# Patient Record
Sex: Female | Born: 1975 | Race: White | Hispanic: No | Marital: Married | Smoking: Former smoker
Health system: Southern US, Community
[De-identification: ages and names within clinical notes are randomized; demographics above are authoritative.]

## PROBLEM LIST (undated history)

## (undated) DIAGNOSIS — I10 Essential (primary) hypertension: Secondary | ICD-10-CM

## (undated) DIAGNOSIS — F329 Major depressive disorder, single episode, unspecified: Secondary | ICD-10-CM

## (undated) DIAGNOSIS — E039 Hypothyroidism, unspecified: Secondary | ICD-10-CM

## (undated) DIAGNOSIS — Z9221 Personal history of antineoplastic chemotherapy: Secondary | ICD-10-CM

## (undated) DIAGNOSIS — F419 Anxiety disorder, unspecified: Secondary | ICD-10-CM

## (undated) DIAGNOSIS — F32A Depression, unspecified: Secondary | ICD-10-CM

## (undated) HISTORY — PX: OTHER SURGICAL HISTORY: SHX169

## (undated) HISTORY — DX: Anxiety disorder, unspecified: F41.9

## (undated) HISTORY — DX: Hypothyroidism, unspecified: E03.9

## (undated) HISTORY — DX: Major depressive disorder, single episode, unspecified: F32.9

## (undated) HISTORY — DX: Essential (primary) hypertension: I10

## (undated) HISTORY — DX: Depression, unspecified: F32.A

---

## 1994-05-21 HISTORY — PX: SHOULDER SURGERY: SHX246

## 2001-01-15 ENCOUNTER — Emergency Department (HOSPITAL_COMMUNITY): Admission: EM | Admit: 2001-01-15 | Discharge: 2001-01-15 | Payer: Self-pay | Admitting: Emergency Medicine

## 2001-01-15 ENCOUNTER — Encounter: Payer: Self-pay | Admitting: *Deleted

## 2001-10-02 ENCOUNTER — Other Ambulatory Visit: Admission: RE | Admit: 2001-10-02 | Discharge: 2001-10-02 | Payer: Self-pay | Admitting: Obstetrics & Gynecology

## 2001-11-19 ENCOUNTER — Inpatient Hospital Stay (HOSPITAL_COMMUNITY): Admission: AD | Admit: 2001-11-19 | Discharge: 2001-11-19 | Payer: Self-pay | Admitting: Obstetrics & Gynecology

## 2002-02-12 ENCOUNTER — Ambulatory Visit (HOSPITAL_COMMUNITY): Admission: RE | Admit: 2002-02-12 | Discharge: 2002-02-12 | Payer: Self-pay | Admitting: Obstetrics and Gynecology

## 2002-02-17 ENCOUNTER — Encounter: Admission: RE | Admit: 2002-02-17 | Discharge: 2002-05-18 | Payer: Self-pay | Admitting: Obstetrics and Gynecology

## 2002-05-18 ENCOUNTER — Inpatient Hospital Stay (HOSPITAL_COMMUNITY): Admission: AD | Admit: 2002-05-18 | Discharge: 2002-05-20 | Payer: Self-pay | Admitting: Obstetrics and Gynecology

## 2003-06-24 ENCOUNTER — Other Ambulatory Visit: Admission: RE | Admit: 2003-06-24 | Discharge: 2003-06-24 | Payer: Self-pay | Admitting: Obstetrics and Gynecology

## 2004-08-11 ENCOUNTER — Other Ambulatory Visit: Admission: RE | Admit: 2004-08-11 | Discharge: 2004-08-11 | Payer: Self-pay | Admitting: Obstetrics and Gynecology

## 2007-10-01 ENCOUNTER — Ambulatory Visit (HOSPITAL_COMMUNITY): Admission: RE | Admit: 2007-10-01 | Discharge: 2007-10-01 | Payer: Self-pay | Admitting: Internal Medicine

## 2007-10-20 ENCOUNTER — Ambulatory Visit: Payer: Self-pay | Admitting: Cardiovascular Disease

## 2007-10-30 ENCOUNTER — Encounter: Payer: Self-pay | Admitting: Cardiovascular Disease

## 2007-10-30 ENCOUNTER — Ambulatory Visit: Payer: Self-pay

## 2008-11-30 ENCOUNTER — Ambulatory Visit (HOSPITAL_COMMUNITY): Admission: RE | Admit: 2008-11-30 | Discharge: 2008-11-30 | Payer: Self-pay | Admitting: Obstetrics and Gynecology

## 2009-02-22 ENCOUNTER — Ambulatory Visit (HOSPITAL_COMMUNITY): Admission: RE | Admit: 2009-02-22 | Discharge: 2009-02-22 | Payer: Self-pay | Admitting: Obstetrics and Gynecology

## 2009-03-30 ENCOUNTER — Inpatient Hospital Stay (HOSPITAL_COMMUNITY): Admission: AD | Admit: 2009-03-30 | Discharge: 2009-04-16 | Payer: Self-pay | Admitting: Obstetrics & Gynecology

## 2009-03-31 ENCOUNTER — Encounter: Payer: Self-pay | Admitting: Obstetrics & Gynecology

## 2009-04-14 ENCOUNTER — Encounter (INDEPENDENT_AMBULATORY_CARE_PROVIDER_SITE_OTHER): Payer: Self-pay | Admitting: Obstetrics & Gynecology

## 2009-05-21 HISTORY — PX: OTHER SURGICAL HISTORY: SHX169

## 2009-06-29 ENCOUNTER — Ambulatory Visit (HOSPITAL_COMMUNITY): Admission: RE | Admit: 2009-06-29 | Discharge: 2009-06-29 | Payer: Self-pay | Admitting: Obstetrics and Gynecology

## 2010-05-18 ENCOUNTER — Telehealth (INDEPENDENT_AMBULATORY_CARE_PROVIDER_SITE_OTHER): Payer: Self-pay | Admitting: *Deleted

## 2010-06-11 ENCOUNTER — Encounter: Payer: Self-pay | Admitting: Internal Medicine

## 2010-06-12 ENCOUNTER — Encounter: Payer: Self-pay | Admitting: Obstetrics and Gynecology

## 2010-06-22 NOTE — Progress Notes (Signed)
  Phone Note From Other Clinic   Caller: Trip/Dr.Stevenson Initial call taken by: KM    Faxed Stress Echo over to [phone] [facility]  May 18, 2010 11:47 AM

## 2010-07-25 ENCOUNTER — Encounter: Payer: Self-pay | Admitting: Physician Assistant

## 2010-07-27 ENCOUNTER — Encounter: Payer: Self-pay | Admitting: Physician Assistant

## 2010-07-27 ENCOUNTER — Ambulatory Visit (INDEPENDENT_AMBULATORY_CARE_PROVIDER_SITE_OTHER): Payer: BC Managed Care – PPO | Admitting: Physician Assistant

## 2010-07-27 DIAGNOSIS — Z720 Tobacco use: Secondary | ICD-10-CM | POA: Insufficient documentation

## 2010-07-27 DIAGNOSIS — F32A Depression, unspecified: Secondary | ICD-10-CM

## 2010-07-27 DIAGNOSIS — F329 Major depressive disorder, single episode, unspecified: Secondary | ICD-10-CM

## 2010-07-27 DIAGNOSIS — I10 Essential (primary) hypertension: Secondary | ICD-10-CM | POA: Insufficient documentation

## 2010-07-27 DIAGNOSIS — E039 Hypothyroidism, unspecified: Secondary | ICD-10-CM | POA: Insufficient documentation

## 2010-07-27 DIAGNOSIS — E119 Type 2 diabetes mellitus without complications: Secondary | ICD-10-CM | POA: Insufficient documentation

## 2010-07-27 DIAGNOSIS — F419 Anxiety disorder, unspecified: Secondary | ICD-10-CM | POA: Insufficient documentation

## 2010-07-27 DIAGNOSIS — R079 Chest pain, unspecified: Secondary | ICD-10-CM | POA: Insufficient documentation

## 2010-07-27 NOTE — Progress Notes (Signed)
History of Present Illness: Primary Cardiologist:  Dr. Charlton Haws  The patient is a 35 year old female who was evaluated by Dr. Eden Emms in 2009 for chest pain and palpitations.  She had a stress echocardiogram.  This demonstrated normal LV function and no ischemia.  She delivered premature twins about a year ago.  She has been under a great deal of stress since that time.  She was diagnosed with hypertension last August.  She was placed on metoprolol at that time.  She was complaining of some palpitations as well as chest discomfort.  This seemed to help her palpitations for quite some time.  However, 3 or 4 weeks ago she started to note chest discomfort and palpitations again.  She described a skipping sensation.  She feels that her heart rate is somewhat fast.  The chest discomfort has awoken her in the middle of the night before.  She describes it as a heavy sensation.  It is located substernally.  She had some left arm heaviness and numbness associated with it.  She denies any neck injuries.  Her left arm continues to be numb.  She has been painting quite a bit around her house.  She also chases two 49-year-old twins around her house.  She has some chest discomfort with exertion.  However, she is able to exert herself without chest discomfort.  She notes shortness of breath with her chest pain.  She denies exertional shortness of breath.  She describes NYHA class 1-2 symptoms.  She denies orthopnea, PND or pedal edema.  She denies syncope or near-syncope.  There is no reported history of snoring.  She denies daytime hypersomnolence.  Past Medical History  Diagnosis Date  . Chest pain     Stress echocardiogram June 2009: Normal LV function; no ischemia  . Hypothyroidism     No longer taking medication  . Hypertension Diagnosed August 2011  . Diabetes mellitus     Diet-controlled  . Anxiety and depression     Current Outpatient Prescriptions  Medication Sig Dispense Refill  . ALPRAZolam (XANAX)  0.5 MG tablet Take 0.5 mg by mouth 3 (three) times daily as needed.        . DULoxetine (CYMBALTA) 60 MG capsule Take 60 mg by mouth daily.        . metoprolol (TOPROL-XL) 50 MG 24 hr tablet Take 50 mg by mouth daily.          Allergies  Allergen Reactions  . Amoxicillin   . Aspirin Free Childs (Acetaminophen)   . Demerol   . Erythromycin   . Penicillins    History   Social History  . Marital Status: Married    Spouse Name: N/A    Number of Children: 7  . Years of Education: N/A   Occupational History  . Housewife    Social History Main Topics  . Smoking status: Current Everyday Smoker -- 0.5 packs/day for 24 years  . Smokeless tobacco: Not on file  . Alcohol Use: No  . Drug Use: No  . Sexually Active:    Other Topics Concern  . Not on file   Social History Narrative  . No narrative on file    ROS: Please see the history of present illness.  All other systems reviewed and negative.  Vital Signs: BP 116/75  Pulse 77  Resp 14  Ht 5\' 6"  (1.676 m)  Wt 236 lb (107.049 kg)  BMI 38.09 kg/m2  PHYSICAL EXAM: Well nourished, well developed, in no  acute distress HEENT: normal Neck: no JVD Cardiac:  normal S1, S2; RRR; no murmur Lungs:  clear to auscultation bilaterally, no wheezing, rhonchi or rales Abd: soft, nontender, no hepatomegaly Ext: no edema Skin: warm and dry Neuro:  CNs 2-12 intact, no focal abnormalities noted Endo: No thyromegaly Vascular: No carotid bruits; DP/PT 2+ bilaterally Psych: Normal affect  EKG: Normal sinus rhythm, heart rate 77, normal axis, nonspecific ST-T wave changes  LABS: From her PCPs office dated 07/25/10: Hemoglobin 13.8, potassium 5.0, creatinine 0.88, glucose 126, TC 182, Trig. 151, HDL 43, LDL 109, ALT 13, hemoglobin A1c 5.8

## 2010-07-27 NOTE — Assessment & Plan Note (Signed)
She is working on cessation.

## 2010-07-27 NOTE — Assessment & Plan Note (Addendum)
She has typical and atypical features to her chest symptoms.  Her EKG is not significantly abnormal.  She does have some nonspecific changes.  She certainly has significant risk factors for coronary disease given her diabetes and ongoing smoking.  I suggest that we proceed with a stress Myoview study.  She cannot take aspirin.  She will continue on metoprolol.  She also has some symptoms of dyspepsia.  I have suggested that she take Pepcid 20 mg twice a day for 3-4 weeks.  She can take as needed thereafter.  I will bring her back in followup with Dr. Eden Emms in the next 2-3 weeks to followup on the above testing.  She knows to contact us if she has any change in her symptoms.  Other possibilities for her chest discomfort and arm symptoms include musculoskeletal symptoms.  She does have a recent history of excessive left arm use with her painting.  This will have to be kept in mind as her evaluation is completed.

## 2010-07-27 NOTE — Assessment & Plan Note (Addendum)
Recent A1c less than 6. I would consider placing her on a statin to get her LDL less than 100 with her history of diabetes.  This is followed by her PCP.

## 2010-07-27 NOTE — Assessment & Plan Note (Signed)
This may be contributing to her symptoms.  Workup as above to rule out ischemic heart disease.

## 2010-07-27 NOTE — Assessment & Plan Note (Addendum)
Controlled.  

## 2010-08-01 NOTE — Assessment & Plan Note (Signed)
Summary: Please see noted done in [facility] that is scanned in.   CC:  chest pains also pt complians of left arm numbness.  History of Present Illness: Please see noted done in [facility] that is scanned in.   Current Medications (verified): 1)  Metoprolol Succinate 50 Mg Xr24h-Tab (Metoprolol Succinate) .... Take One Tablet By Mouth Daily  Vital Signs:  Patient profile:   35 year old female Height:      66 inches Weight:      236 pounds BMI:     38.23 Pulse rate:   77 / minute Resp:     14 per minute BP sitting:   116 / 75  (left arm)  Vitals Entered By: Kem Parkinson (July 27, 2010 11:15 AM)   Other Orders: Nuclear Stress Test (Nuc Stress Test)  Patient Instructions: 1)  Your physician recommends that you schedule a follow-up appointment in:2 to 3 weeks with Dr. Eden Emms 2)  Your physician has recommended you make the following change in your medication: Pepcid 20 mg take one tablet twice a day for 3 to 4 weeks, then twice a day as needed. 3)  Your physician has requested that you have an exercise stress myoview.  For further information please visit [email or web address]  Please follow instruction sheet, as given. Prescriptions: PEPCID 20 MG TABS (FAMOTIDINE) Take one tablet by mouth two times a day for 3 to 4 weeks, then  take one tablet two times a day as needed .  #30 x 4   Entered by:   Ollen Gross, RN, BSN   Authorized by:   Tereso Newcomer PA-C   Signed by:   Ollen Gross, RN, BSN on 07/27/2010   Method used:   Electronically to        [facility] (retail)       [address]       [place], [place]  [address]       Ph: [phone]       Fax: [phone]   RxID:   [id number]  I have personally reviewed the prescriptions today for accuracy. Tereso Newcomer PA-C  July 27, 2010 1:47 PM

## 2010-08-09 ENCOUNTER — Encounter: Payer: Self-pay | Admitting: Physician Assistant

## 2010-08-09 LAB — PREGNANCY, URINE: Preg Test, Ur: NEGATIVE

## 2010-08-09 LAB — CBC
MCHC: 33.8 g/dL (ref 30.0–36.0)
MCV: 86.1 fL (ref 78.0–100.0)
Platelets: 181 10*3/uL (ref 150–400)
RBC: 4.38 MIL/uL (ref 3.87–5.11)

## 2010-08-10 ENCOUNTER — Ambulatory Visit (HOSPITAL_COMMUNITY): Payer: BC Managed Care – PPO | Attending: Cardiovascular Disease | Admitting: Radiology

## 2010-08-10 DIAGNOSIS — R079 Chest pain, unspecified: Secondary | ICD-10-CM

## 2010-08-10 DIAGNOSIS — R0609 Other forms of dyspnea: Secondary | ICD-10-CM

## 2010-08-10 MED ORDER — TECHNETIUM TC 99M TETROFOSMIN IV KIT
11.0000 | PACK | Freq: Once | INTRAVENOUS | Status: AC | PRN
  Administered 2010-08-10: 11 via INTRAVENOUS

## 2010-08-10 MED ORDER — TECHNETIUM TC 99M TETROFOSMIN IV KIT
33.0000 | PACK | Freq: Once | INTRAVENOUS | Status: AC | PRN
  Administered 2010-08-10: 33 via INTRAVENOUS

## 2010-08-10 NOTE — Progress Notes (Signed)
[facility] SITE 3 NUCLEAR MED [address] [place] [address] [phone]  Cardiology Nuclear Med Study Jasmine Sexton female August 09, 1975   Nuclear Med Background Indication for Stress Test:  Evaluation for Ischemia History: 6/09 Stress  Echo - Normal Cardiac Risk Factors: Hypertension  Symptoms:  Chest Pain (last date of chest discomfort yesterday evening), Chest Pain with Exertion (last date of chest discomfort yesterday evening), DOE, Fatigue, Light-Headedness, Palpitations and Rapid HR   Nuclear Pre-Procedure Caffeine/Decaff Intake:  None NPO After: 7:00pm   Lungs:  Clear IV 0.9% NS with Angio Cath:  22g  IV Site: R Hand  IV Started by:  Bonnita Levan, RN  Chest Size (in):  38 Cup Size:   D  Height: 5\' 6"  (1.676 m)  Weight:  238 lb (107.956 kg)  BMI:  Body mass index is 38.41 kg/(m^2). Tech Comments: Held Metoprolol x 24 hrs     Nuclear Med Study 1 or 2 day study: 1 day  Stress Test Type:  Stress  Reading MD: Olga Millers, MD  Order Authorizing Provider:  Dr. Burna Forts  Resting Radionuclide: Technetium 92m Tetrofosmin  Resting Radionuclide Dose: 11 mCi   Stress Radionuclide:  Technetium 48m Tetrofosmin  Stress Radionuclide Dose: 33 mCi           Stress Protocol Rest HR: 74 Stress HR: 176  Rest BP: 130/80 Stress BP: 167/74  Exercise Time:  8:01 min METS: 10.2  Predicted HR: 183 % of Maximum: 96    Predicted Max HR: 183 bpm % Max HR: 96.17 bpm Rate Pressure Product: 95621    Dose of Adenosine:  N/A mg Dose of Lexiscan:  N/A mg  Dose of Atropine:  N/A mg Dose of Dobutamine:  N/A mcg/kg/min (at max HR)  Stress Test Technologist: Bonnita Levan, RN  Nuclear Technologist:  Domenic Polite, CNMT     Rest Procedure:  Myocardial perfusion imaging was performed at rest 45 minutes following the intravenous administration of Technetium 62m Tetrofosmin. Rest ECG: NSR  Stress Procedure:  The patient exercised for 8:01.  The patient stopped due to  leg fatigue and dyspnea.  She c/o chest pain 2/10 during exercise, which resolved in recovery.   There were non-specific ST-T wave changes.  Technetium 62m Tetrofosmin was injected at peak exercise and myocardial perfusion imaging was performed after a brief delay. Stress ECG: No significant change from baseline ECG  QPS Raw Data Images:  Mild breast attenuation.  Normal left ventricular size. Stress Images:  There is decreased uptake in the anterior wall. Rest Images:  There is decreased uptake in the anterior wall. Subtraction (SDS):  There is a fixed anteriour defect that is most consistent with breast attenuation.  Findings Risk Category:  Low risk nuclear study. Clinically Abnormal:  Chest Pain Ischemia:  No Fixed Defect:  Anterior (septal apical) LV Dysfunction:  No Transient Ischemic Dilatation (Normal <1.22):  1.06 Lung/Heart Ratio (Normal <0.45):  .32  Quantitative Gated Spect Images QGS EDV:  92 ml QGS ESV:  28 ml QGS cine images:  Normal wall motion. QGS EF:  69 %  Impression Exercise Capacity:  Good exercise capacity. BP Response:  Normal blood pressure response. Clinical Symptoms:  Mild chest pain/dyspnea. ECG Impression:  No significant ST segment change suggestive of ischemia. Comparison with Prior Nuclear Study: No previous nuclear study performed  Overall Impression:  Low risk stress nuclear study with breast attenuation but no ischemia; note patient did have chest pain with exercise.

## 2010-08-11 ENCOUNTER — Encounter: Payer: Self-pay | Admitting: Physician Assistant

## 2010-08-11 ENCOUNTER — Telehealth: Payer: Self-pay | Admitting: Physician Assistant

## 2010-08-11 ENCOUNTER — Encounter: Payer: Self-pay | Admitting: Cardiovascular Disease

## 2010-08-11 NOTE — Telephone Encounter (Signed)
PT AWARE OF STRESS TEST RESULTS TODAY, GAVE VERBAL UNDERSTANDING TODAY

## 2010-08-11 NOTE — Telephone Encounter (Signed)
Please call patient and notify her that her stress test is normal.

## 2010-08-16 ENCOUNTER — Ambulatory Visit: Payer: BC Managed Care – PPO | Admitting: Physician Assistant

## 2010-08-23 LAB — CBC
HCT: 32.3 % — ABNORMAL LOW (ref 36.0–46.0)
HCT: 32.3 % — ABNORMAL LOW (ref 36.0–46.0)
Hemoglobin: 10.9 g/dL — ABNORMAL LOW (ref 12.0–15.0)
MCHC: 33.8 g/dL (ref 30.0–36.0)
MCHC: 34.2 g/dL (ref 30.0–36.0)
MCHC: 34.2 g/dL (ref 30.0–36.0)
MCV: 87.5 fL (ref 78.0–100.0)
MCV: 88.1 fL (ref 78.0–100.0)
MCV: 86.9 fL (ref 78.0–100.0)
Platelets: 164 10*3/uL (ref 150–400)
Platelets: 187 10*3/uL (ref 150–400)
Platelets: 190 10*3/uL (ref 150–400)
RBC: 3.53 MIL/uL — ABNORMAL LOW (ref 3.87–5.11)
RBC: 3.81 MIL/uL — ABNORMAL LOW (ref 3.87–5.11)
RDW: 13.1 % (ref 11.5–15.5)
RDW: 13.2 % (ref 11.5–15.5)
RDW: 13.2 % (ref 11.5–15.5)
WBC: 9.9 10*3/uL (ref 4.0–10.5)

## 2010-08-23 LAB — GLUCOSE, CAPILLARY: Glucose-Capillary: 91 mg/dL (ref 70–99)

## 2010-08-23 LAB — VON WILLEBRAND FACTOR MULTIMER
Factor-VIII Activity: 123 % (ref 50–180)
Von Willebrand Factor Ag: 207 % (ref 50–217)

## 2010-08-23 LAB — STREP B DNA PROBE: Strep Group B Ag: NEGATIVE

## 2010-08-23 LAB — GLUCOSE, 2 HOUR GESTATIONAL: Glucose Tolerance, 2 hour: 198 mg/dL — ABNORMAL HIGH (ref 70–164)

## 2010-08-23 LAB — FACTOR 8 ASSAY: Coagulation Factor VIII: 143 % — ABNORMAL HIGH (ref 73–140)

## 2010-08-23 LAB — RH IMMUNE GLOB WKUP(>/=20WKS)(NOT WOMEN'S HOSP): Fetal Screen: NEGATIVE

## 2010-08-23 LAB — GLUCOSE, FASTING GESTATIONAL: Glucose Tolerance, Fasting: 102 mg/dL

## 2010-08-23 LAB — FETAL FIBRONECTIN: Fetal Fibronectin: POSITIVE

## 2010-08-23 LAB — HEMOGLOBIN A1C: Mean Plasma Glucose: 114 mg/dL

## 2010-08-23 LAB — WET PREP, GENITAL: Yeast Wet Prep HPF POC: NONE SEEN

## 2010-08-23 LAB — GLUCOSE, 3 HOUR GESTATIONAL: Glucose, GTT - 3 Hour: 183 mg/dL — ABNORMAL HIGH (ref 70–144)

## 2010-10-03 NOTE — Assessment & Plan Note (Signed)
[facility]                            CARDIOLOGY OFFICE NOTE   NAME:Jasmine Sexton, Jasmine Sexton                      MRN:          [id number]  DATE:10/20/2007                            DOB:          Feb 15, 1976    [age]-old patient referred for palpitations and chest pain.  She  has been having symptoms for about six months. They are atypical.  The  chest pain comes and goes.  There is no rhyme or reason to it.  It is  not related to exertion.  She can wake up with it.  She feels a lot of  pressure in her chest like someone is sitting on it.  There is no  associated diaphoresis or shortness of breath.  She does occasionally  get palpitations.  They are skips.  They are not prolonged rapid beats,  so there is no associated presyncope.  She has not had previous heart  problems.   There is a history of anxiety and depression postpartum.   The patient does appear to be under a little bit of stress.  She has a  good home life, but she does have five children and things are fairly  hectic.   The patient does have hypothyroidism.  She is on replacement.  Apparently he dose was decreased recently due to a suppressed TSH.  She  was told by Dr.  Elisabeth Most that this could have something to do with her  blood pressure and heart rate being up.   Patient's review of systems is otherwise remarkable for question of  anemia, fatigue.   Her past surgical history is remarkable for  shoulder surgery in 1996  and previous laparoscopic surgery in 1992 and 1993.   Patient also indicates that she has had a history of  a heart murmur.   SHE IS ALLERGIC TO AMOXICILLIN, ERYTHROMYCIN, PENICILLIN, DEMEROL,  CODEINE AND ASPIRIN.  SHE SAID SHE SWELLS UP WITH ASPIRIN.   Patient smokes a half a pack a day for the last six years.  She is  happily married.  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  She  has five children ranging in age from age 11 to 51.  She is otherwise  fairly sedentary.   Her current medications only include Synthroid 137 mcg 1/2 tablet daily.   Her exam is remarkable for an overweight white female in no distress.  Affect is appropriate.  Weight 221, blood pressure 120/80, pulse 81 and  regular.  Afebrile.  Respiratory rate 14.  HEENT:  Unremarkable.  Carotids normal without bruit.  No  lymphadenopathy, thyromegaly, JVP elevation.  LUNGS:  Clear with good diaphragmatic motion.  No wheezing.  S1 and S2 with a soft systolic murmur.  PMI normal.  ABDOMEN:  Benign.  Bowel sounds positive.  No AAA.  No tenderness.  No  bruit.  No hepatosplenomegaly, hepatojugular reflux.  No tenderness.  Distal pulses are intact.  No edema.  NEURO:  Nonfocal.  SKIN: Warm and dry.  No bruit.   EKG shows sinus rhythm with nonspecific ST-T wave changes.  Poor R  wave  progression.   1. Chest pain, atypical.  Followup stress echo.  2. Palpitations.  We will give her p.r.n. Inderal to take.  I suspect      some of this has to do with her thyroid being too high, and also      related to anxiety and depression.  3. Hypothyroidism.  Followup with Dr. Elisabeth Most.  Adjust Synthroid to      normal TSH values.  4. Anxiety, depression.  Follow up with Dr. Elisabeth Most.  May benefit      from selective serotonin reuptake inhibitor.  5. Smoking cessation.  Again, given the patient's anxiety, depression,      Wellbutrin may be a good medication for her.  I counseled the      patient for less than ten minutes regarding the effects of smoking.  6. Central obesity.  The patient needs to be on a low carbohydrate      diet.  Her dietary habits are somewhat poor.  I explained the link      between the carbohydrates, insulin resistance and weight gain to      the patient.  7. I will see the patient back in three months as long as her stress      echo is low risk, and then we will see how she responds to p.r.n.      Inderal.     Peter C. Eden Emms, MD, [facility]  Electronically  Signed    PCN/MedQ  DD: 10/20/2007  DT: 10/20/2007  Job #: [id number]   cc:   Lovenia Kim, D.O.

## 2010-10-04 IMAGING — US US OB TRANSVAGINAL
1 series · 14 of 28 positions shown · non-contrast
Comparison: none

OBSTETRICAL ULTRASOUND:
 This ultrasound exam was performed in the [HOSPITAL] Ultrasound Department.  The OB US report was generated in the AS system, and faxed to the ordering physician.  This report is also available in [REDACTED] PACS.

[Series 1: us ob transvaginal · 0.12mm/px · 43 acquisitions, 14 frames shown]
[im 2/43]
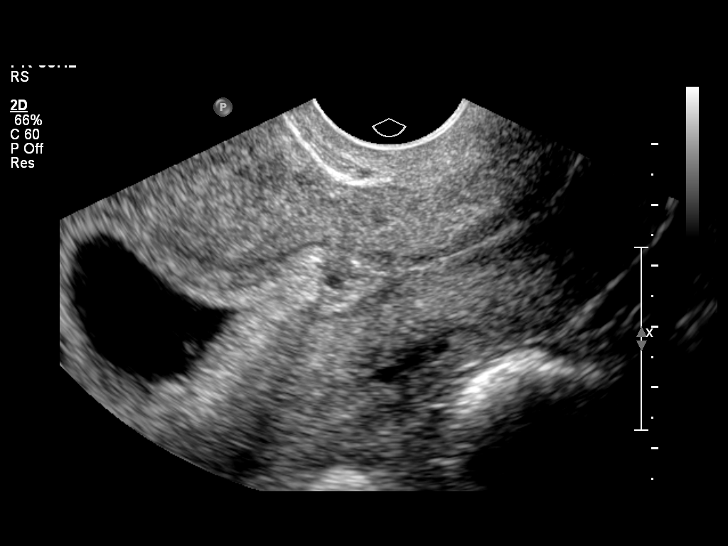
[im 5/43]
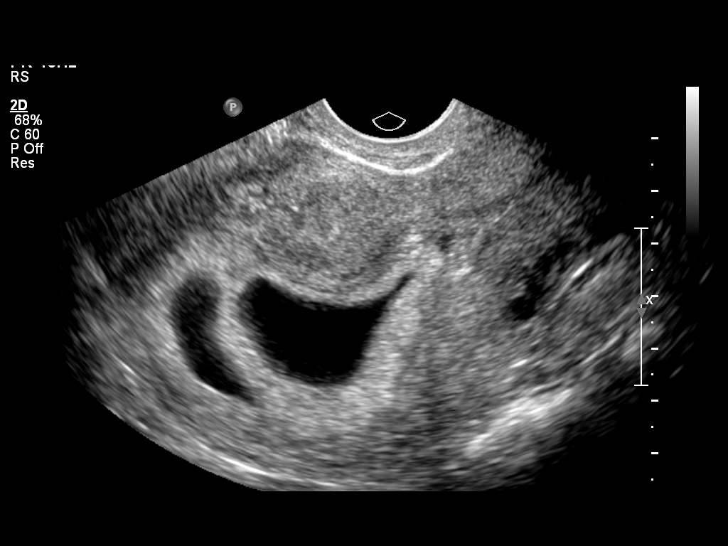
[im 8/43]
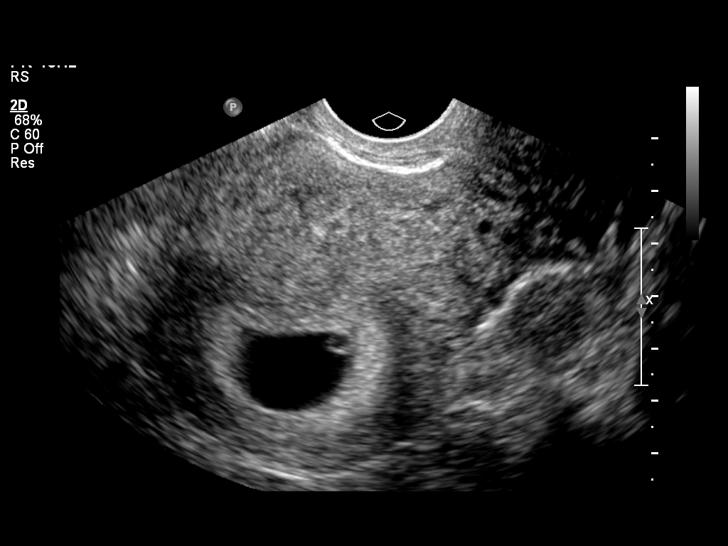
[im 11/43]
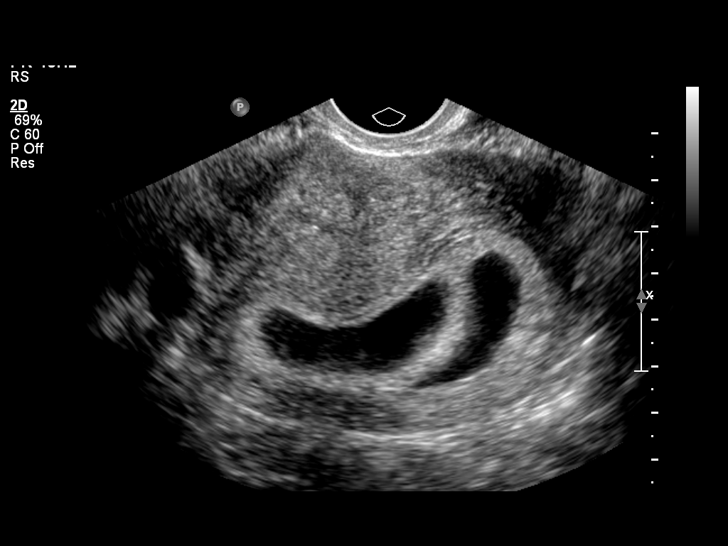
[im 15/43]
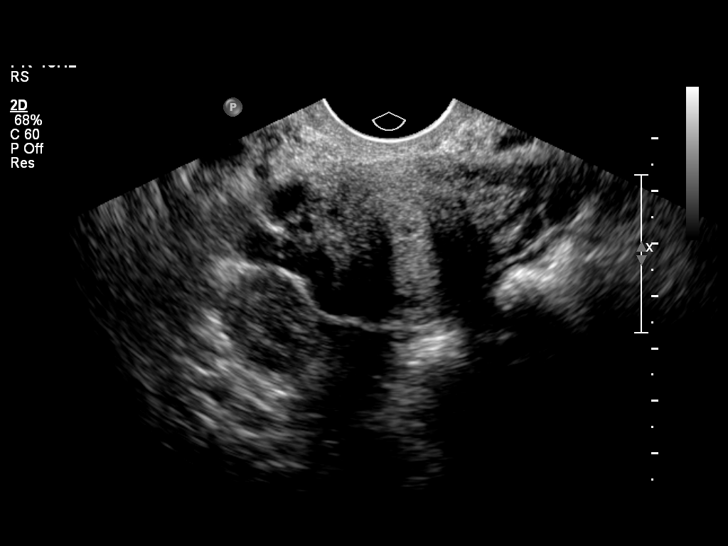
[im 18/43]
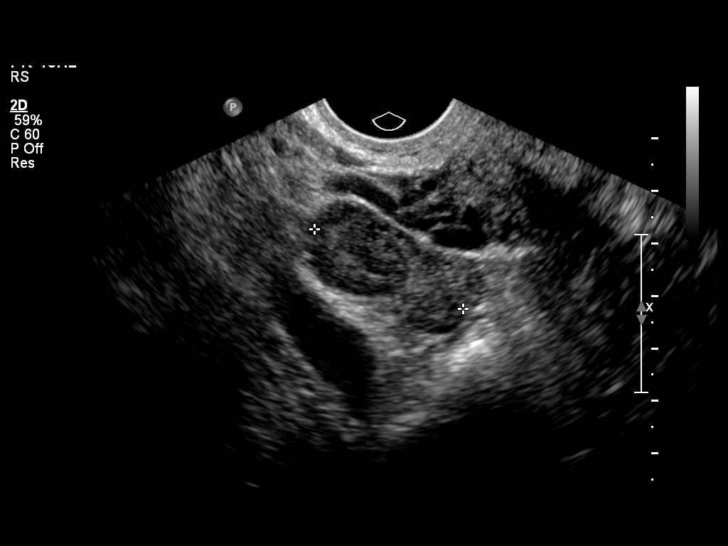
[im 21/43]
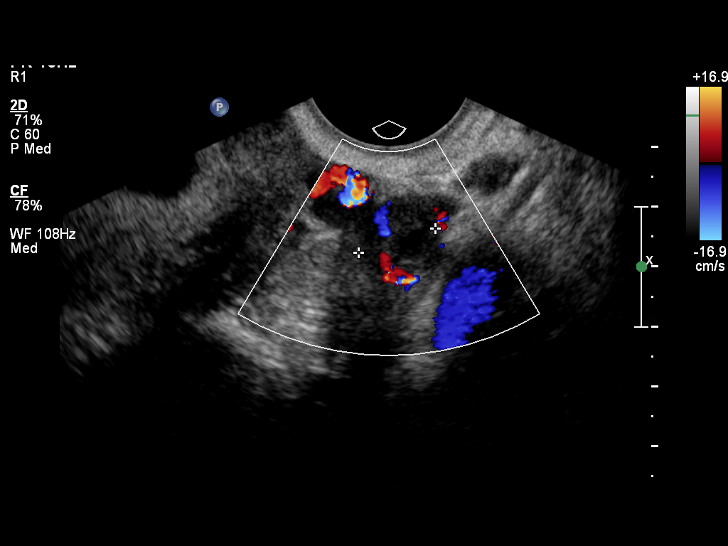
[im 24/43]
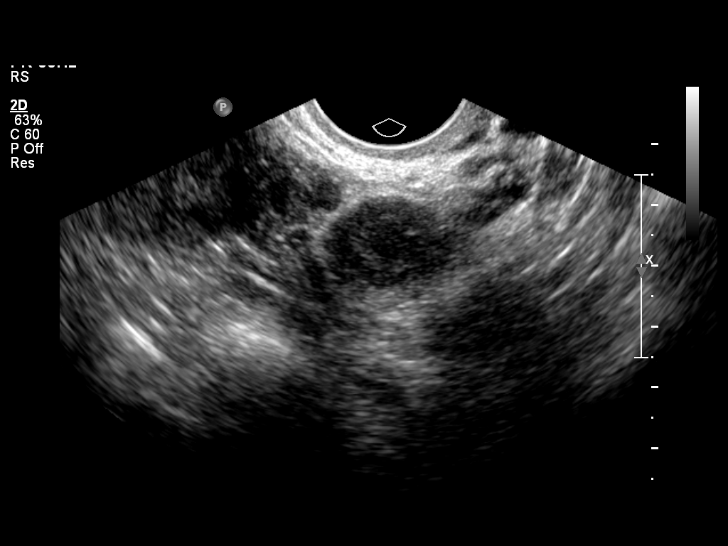
[im 27/43]
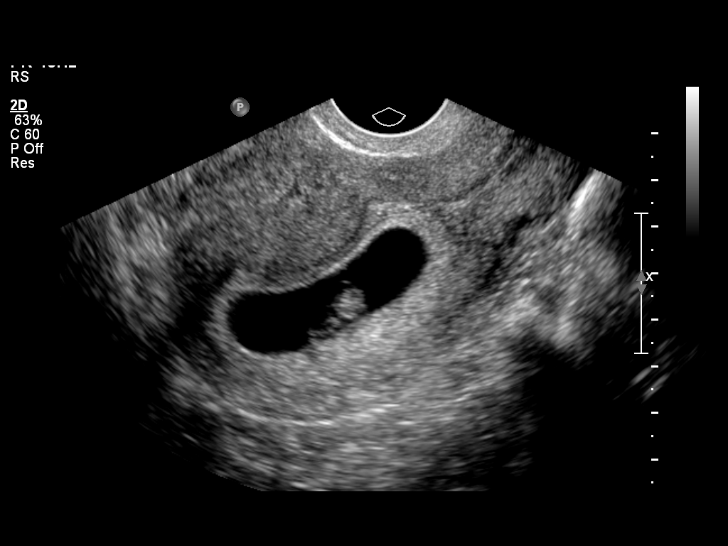
[im 30/43]
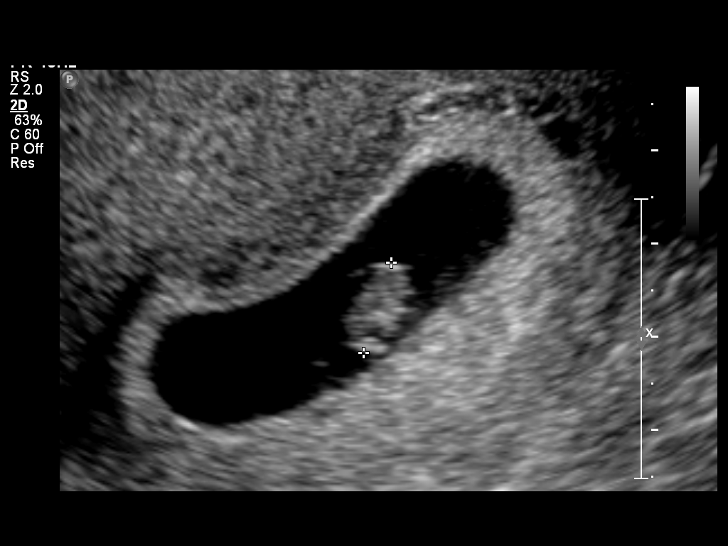
[im 33/43]
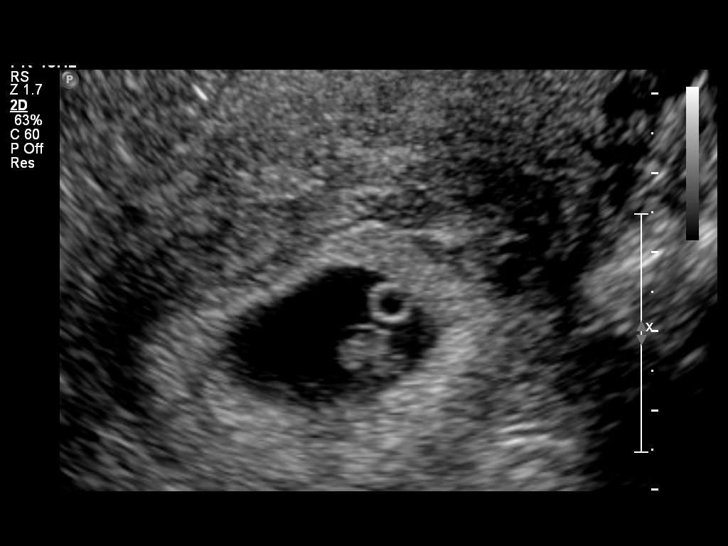
[im 36/43]
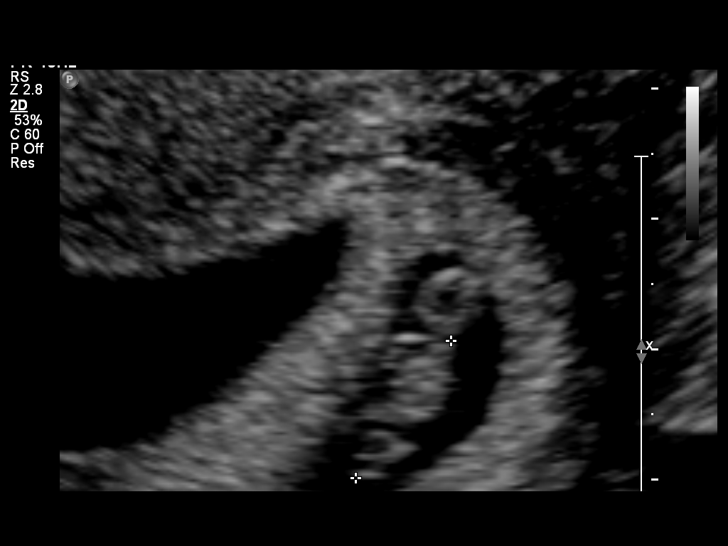
[im 39/43]
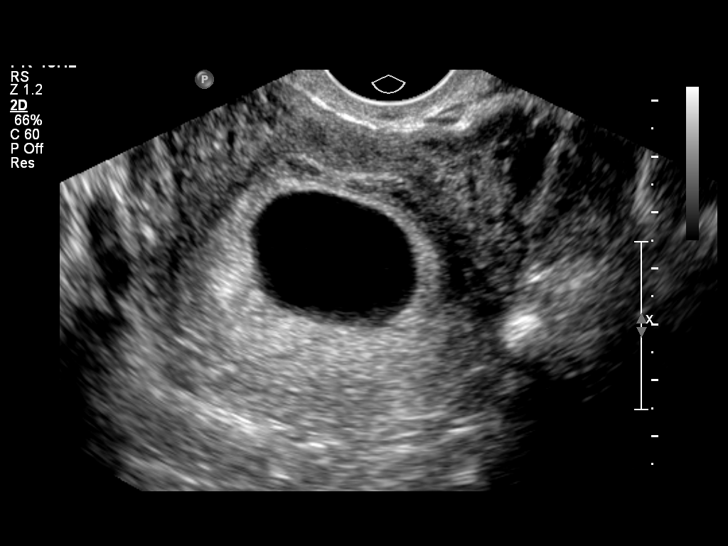
[im 43/43]
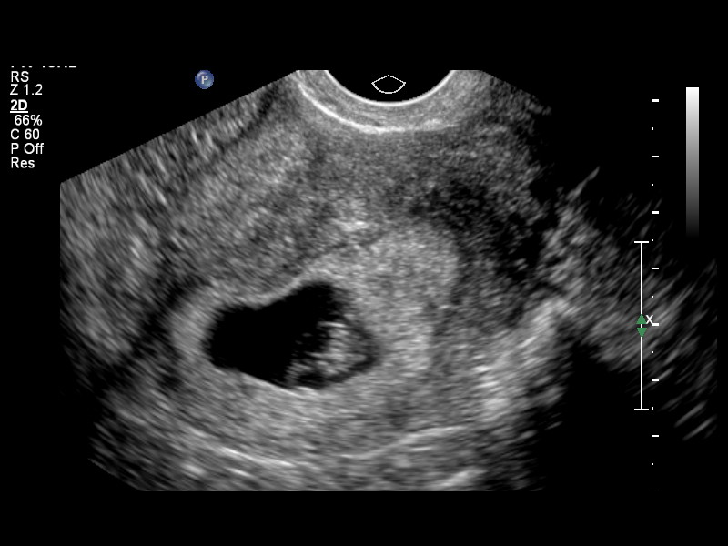

[14 of 28 positions shown; findings below may reference images not displayed]

IMPRESSION: See AS Obstetric US report.

## 2010-12-27 IMAGING — US US OB DETAIL EACH ADDL GEST + 14 WK
2 of 3 series · 14 of 28 positions shown · non-contrast
Comparison: none

OBSTETRICAL ULTRASOUND:
 This ultrasound exam was performed in the [HOSPITAL] Ultrasound Department.  The OB US report was generated in the AS system, and faxed to the ordering physician.  This report is also available in [HOSPITAL]?s accessANYware and in [REDACTED] PACS.

[Series 1: us ob detail +14 wk · 0.24mm/px · 154 acquisitions, 13 frames shown (1 of 2)]
[im 7/154]
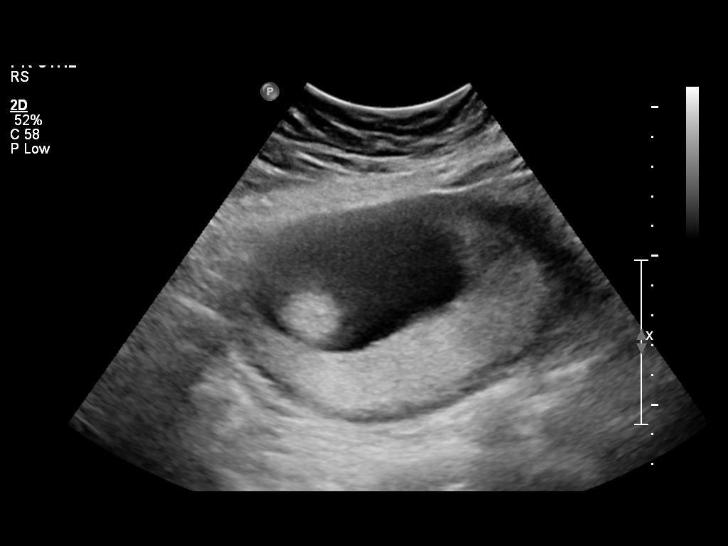
[im 19/154]
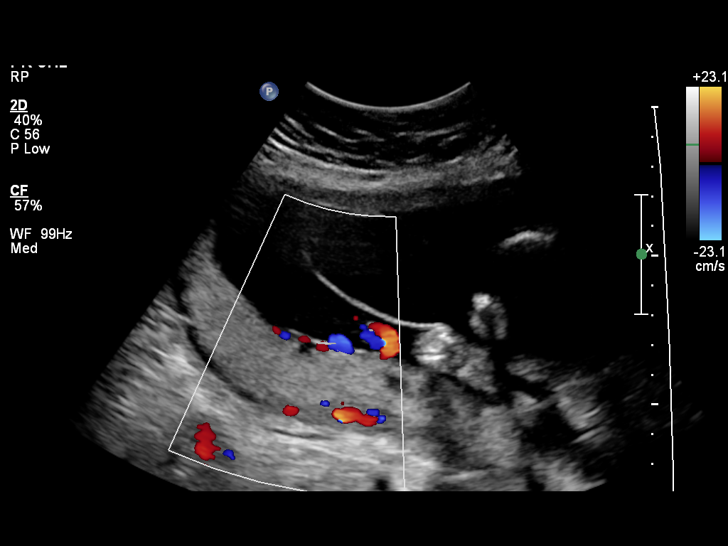
[im 31/154]
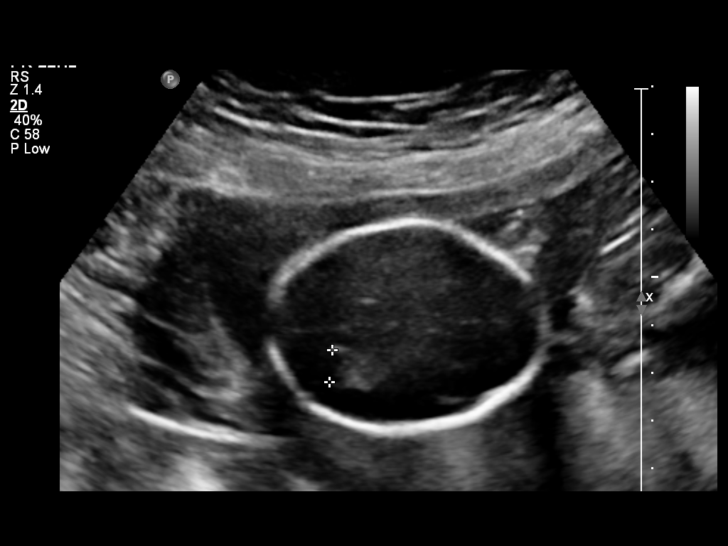
[im 43/154]
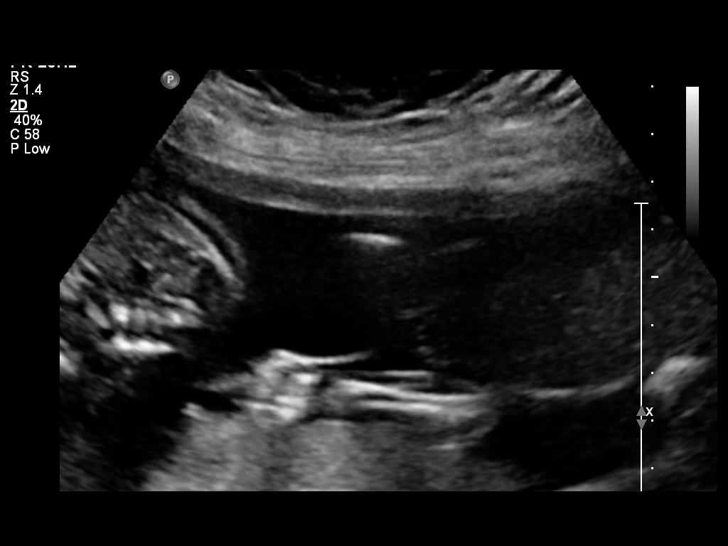
[im 56/154]
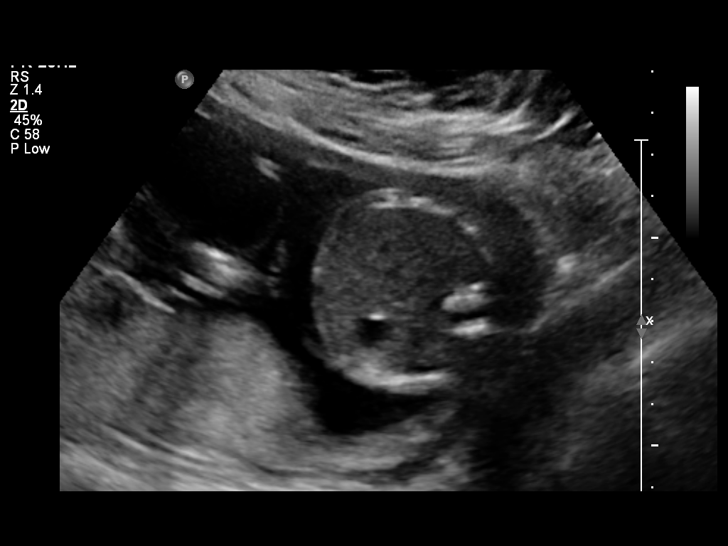
[im 68/154]
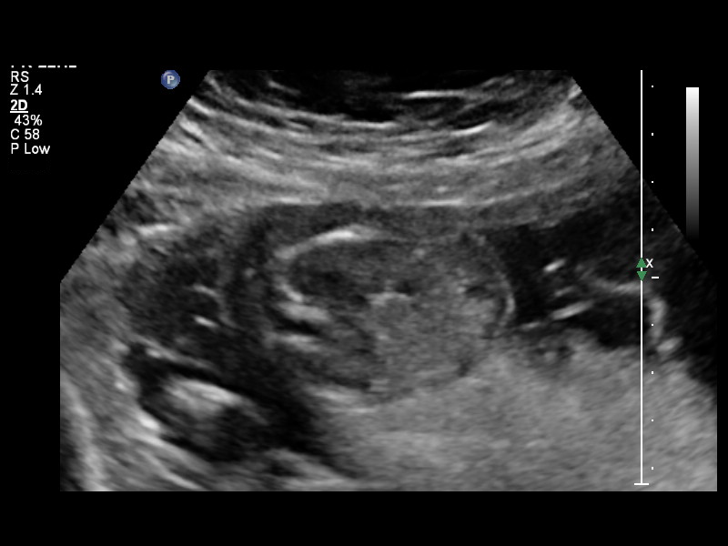
[im 80/154]
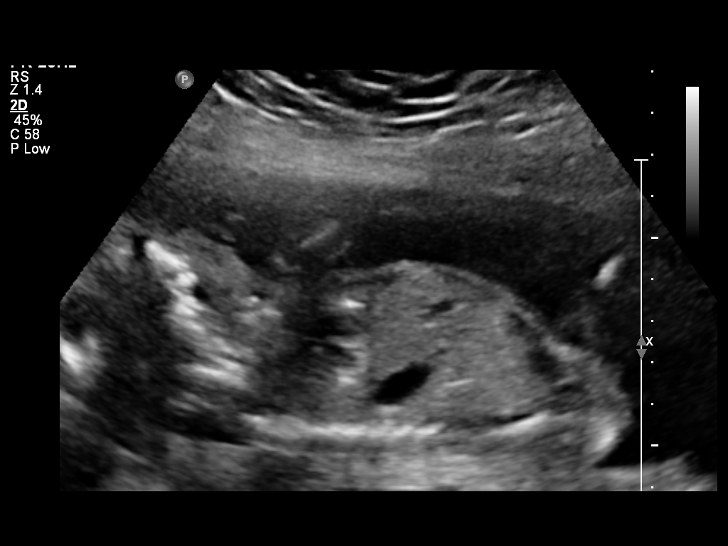
[im 92/154]
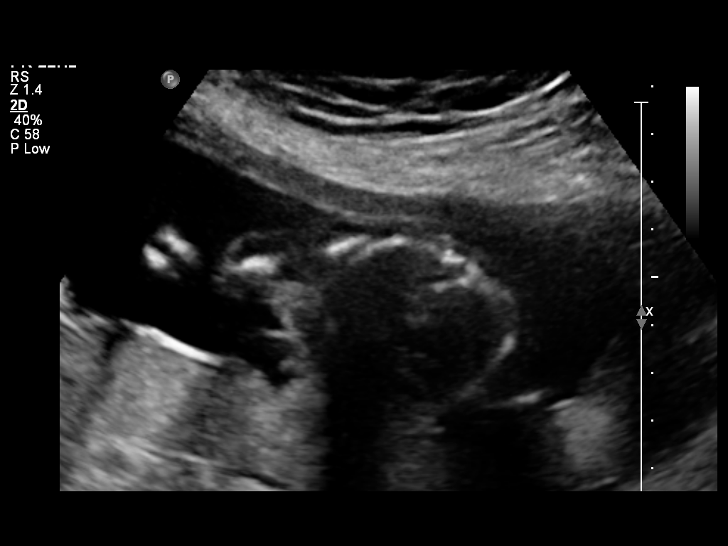
[im 105/154]
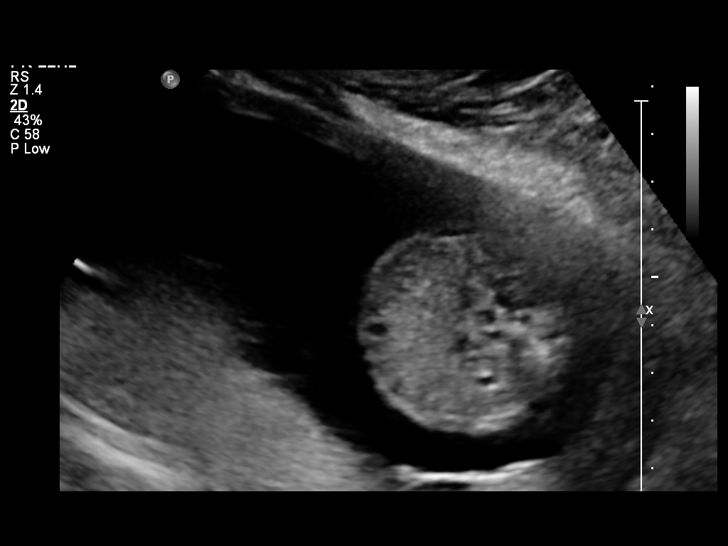
[im 117/154]
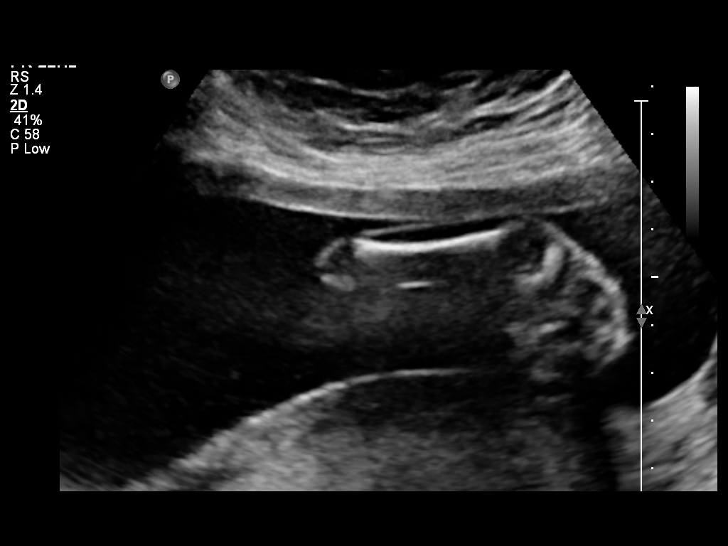
[im 129/154]
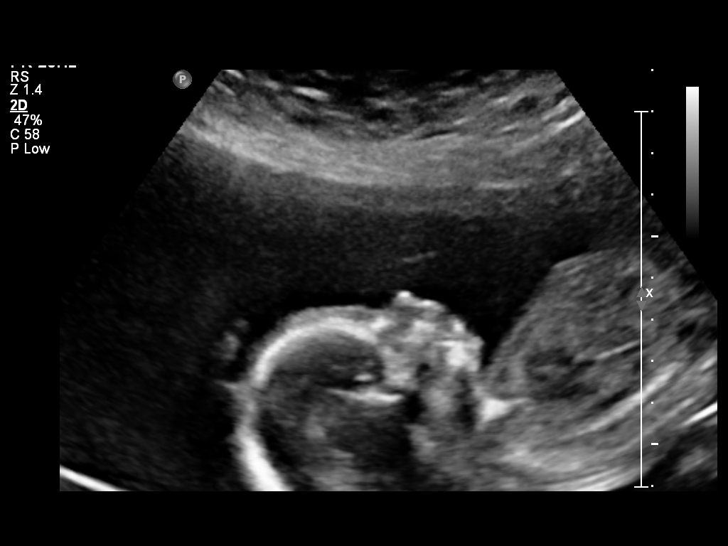
[im 141/154]
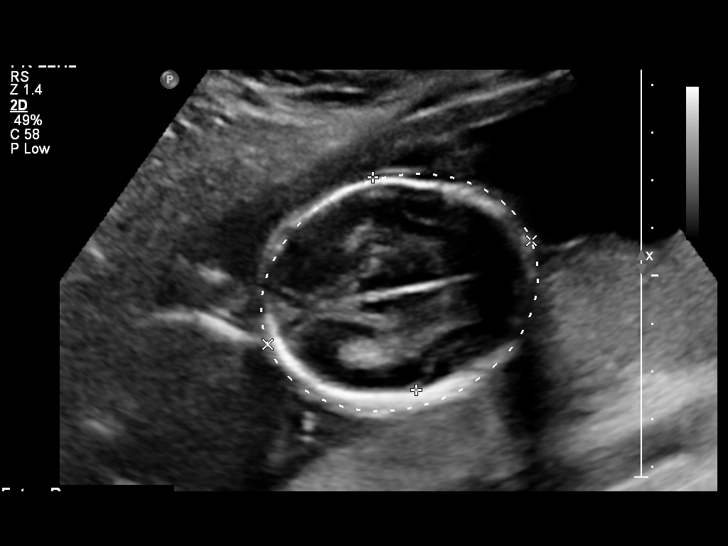
[im 154/154]
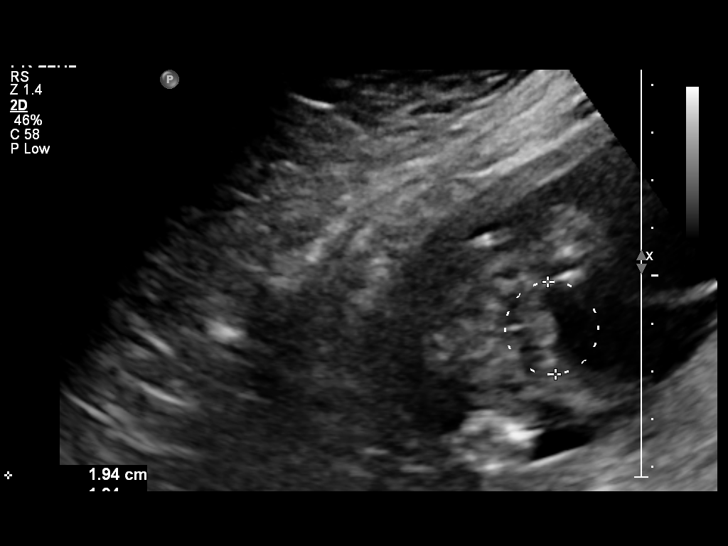

[Series 1: us ob detail +14 wk · 0.12mm/px · 1 of 8 slices shown (2 of 2)]
[im 1/8]
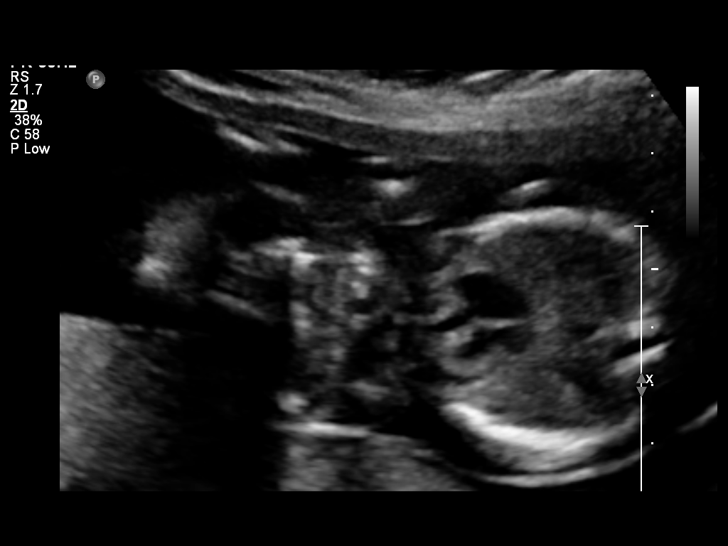

[14 of 28 positions shown; findings below may reference images not displayed]

IMPRESSION: See AS Obstetric US report.

## 2011-07-25 ENCOUNTER — Ambulatory Visit (HOSPITAL_COMMUNITY)
Admission: RE | Admit: 2011-07-25 | Discharge: 2011-07-25 | Disposition: A | Discharge status: Home / Self Care | Payer: BC Managed Care – PPO | Source: Ambulatory Visit | Attending: Internal Medicine | Admitting: Internal Medicine

## 2011-07-25 ENCOUNTER — Other Ambulatory Visit (HOSPITAL_COMMUNITY): Payer: Self-pay | Admitting: Internal Medicine

## 2011-07-25 DIAGNOSIS — R002 Palpitations: Secondary | ICD-10-CM

## 2011-07-25 DIAGNOSIS — F17200 Nicotine dependence, unspecified, uncomplicated: Secondary | ICD-10-CM | POA: Insufficient documentation

## 2012-02-19 ENCOUNTER — Other Ambulatory Visit: Payer: Self-pay | Admitting: Internal Medicine

## 2012-02-19 DIAGNOSIS — N926 Irregular menstruation, unspecified: Secondary | ICD-10-CM

## 2012-02-21 ENCOUNTER — Other Ambulatory Visit: Payer: BC Managed Care – PPO

## 2012-06-06 ENCOUNTER — Telehealth: Payer: Self-pay | Admitting: Cardiovascular Disease

## 2012-06-06 NOTE — Telephone Encounter (Signed)
New problem:   C/O chest pain, headache x 4 days . Sob.

## 2012-06-06 NOTE — Telephone Encounter (Addendum)
Pt. c/o elevated BP, CP and SOB. According to pts chart she has had some cardiac testing done with this office but has never seen a cardiologist. Pt. states that her PCP handles her BP meds. Pt. Is advised to contact her PCP and that if he is not available to call 911 or have someone drive her to Urgent care or the ER, she verbalized understanding.

## 2012-08-22 ENCOUNTER — Ambulatory Visit: Payer: BC Managed Care – PPO | Admitting: Cardiovascular Disease

## 2012-09-11 ENCOUNTER — Ambulatory Visit: Payer: BC Managed Care – PPO | Admitting: Cardiovascular Disease

## 2013-05-20 ENCOUNTER — Emergency Department (INDEPENDENT_AMBULATORY_CARE_PROVIDER_SITE_OTHER)
Admission: EM | Admit: 2013-05-20 | Discharge: 2013-05-20 | Disposition: A | Discharge status: Home / Self Care | Payer: PRIVATE HEALTH INSURANCE | Source: Home / Self Care | Attending: Family Medicine | Admitting: Family Medicine

## 2013-05-20 ENCOUNTER — Encounter (HOSPITAL_COMMUNITY): Payer: Self-pay | Admitting: Emergency Medicine

## 2013-05-20 DIAGNOSIS — R109 Unspecified abdominal pain: Secondary | ICD-10-CM

## 2013-05-20 LAB — POCT I-STAT, CHEM 8
Creatinine, Ser: 0.8 mg/dL (ref 0.50–1.10)
Glucose, Bld: 100 mg/dL — ABNORMAL HIGH (ref 70–99)
HCT: 48 % — ABNORMAL HIGH (ref 36.0–46.0)
Hemoglobin: 16.3 g/dL — ABNORMAL HIGH (ref 12.0–15.0)
Sodium: 140 mEq/L (ref 137–147)
TCO2: 27 mmol/L (ref 0–100)

## 2013-05-20 LAB — POCT URINALYSIS DIP (DEVICE)
Hgb urine dipstick: NEGATIVE
Ketones, ur: NEGATIVE mg/dL
Protein, ur: NEGATIVE mg/dL
Specific Gravity, Urine: <=1.005 (ref 1.005–1.030)

## 2013-05-20 MED ORDER — HYDROCODONE-ACETAMINOPHEN 5-325 MG PO TABS: 1.0000 | ORAL_TABLET | Freq: Four times a day (QID) | ORAL | Status: DC | PRN

## 2013-05-20 MED ORDER — CEPHALEXIN 500 MG PO CAPS: 500.0000 mg | ORAL_CAPSULE | Freq: Four times a day (QID) | ORAL | Status: DC

## 2013-05-20 NOTE — ED Notes (Signed)
C/o UTI or kidney infection for four days now States she has abd pain and back pain and pain when urinating

## 2013-05-20 NOTE — ED Provider Notes (Signed)
Jasmine Sexton is a 37 y.o. female who presents to Urgent Care today for low back pain urinary frequency urgency and dysuria present for the last 4 days. Her symptoms are consistent with UTI. She's been drinking a lot of fluids and attempt to help treat the UTI. She denies any fevers chills nausea vomiting or diarrhea. She feels well otherwise.   Past Medical History  Diagnosis Date  . Chest pain     Stress echocardiogram June 2009: Normal LV function; no ischemia  . Hypothyroidism     No longer taking medication  . Hypertension Diagnosed August 2011  . Diabetes mellitus     Diet-controlled  . Anxiety and depression    History  Substance Use Topics  . Smoking status: Current Every Day Smoker -- 0.50 packs/day for 24 years  . Smokeless tobacco: Not on file  . Alcohol Use: No   ROS as above Medications reviewed. No current facility-administered medications for this encounter.   Current Outpatient Prescriptions  Medication Sig Dispense Refill  . ALPRAZolam (XANAX) 0.5 MG tablet Take 0.5 mg by mouth 3 (three) times daily as needed.        . cephALEXin (KEFLEX) 500 MG capsule Take 1 capsule (500 mg total) by mouth 4 (four) times daily.  40 capsule  0  . DULoxetine (CYMBALTA) 60 MG capsule Take 60 mg by mouth daily.        Marland Kitchen HYDROcodone-acetaminophen (NORCO/VICODIN) 5-325 MG per tablet Take 1 tablet by mouth every 6 (six) hours as needed.  10 tablet  0  . metoprolol (TOPROL-XL) 50 MG 24 hr tablet Take 50 mg by mouth daily.          Exam:  BP 132/88  Pulse 73  Temp(Src) 98.4 F (36.9 C) (Oral)  Resp 18  SpO2 100%  LMP 04/27/2013 Gen: Well NAD HEENT: EOMI,  MMM Lungs: Normal work of breathing. CTABL Heart: RRR no MRG Abd: NABS, Soft. NT, ND no significant CVA tenderness to percussion Exts: Non edematous BL  LE, warm and well perfused.   Results for orders placed during the hospital encounter of 05/20/13 (from the past 24 hour(s))  POCT URINALYSIS DIP (DEVICE)     Status:  None   Collection Time    05/20/13  2:45 PM      Result Value Range   Glucose, UA NEGATIVE  NEGATIVE mg/dL   Bilirubin Urine NEGATIVE  NEGATIVE   Ketones, ur NEGATIVE  NEGATIVE mg/dL   Specific Gravity, Urine <=1.005  1.005 - 1.030   Hgb urine dipstick NEGATIVE  NEGATIVE   pH 6.5  5.0 - 8.0   Protein, ur NEGATIVE  NEGATIVE mg/dL   Urobilinogen, UA 0.2  0.0 - 1.0 mg/dL   Nitrite NEGATIVE  NEGATIVE   Leukocytes, UA NEGATIVE  NEGATIVE  POCT PREGNANCY, URINE     Status: None   Collection Time    05/20/13  2:52 PM      Result Value Range   Preg Test, Ur NEGATIVE  NEGATIVE  POCT I-STAT, CHEM 8     Status: Abnormal   Collection Time    05/20/13  3:02 PM      Result Value Range   Sodium 140  137 - 147 mEq/L   Potassium 4.4  3.7 - 5.3 mEq/L   Chloride 102  96 - 112 mEq/L   BUN 9  6 - 23 mg/dL   Creatinine, Ser 5.28  0.50 - 1.10 mg/dL   Glucose, Bld 413 (*) 70 -  99 mg/dL   Calcium, Ion 3.32 (*) 1.12 - 1.23 mmol/L   TCO2 27  0 - 100 mmol/L   Hemoglobin 16.3 (*) 12.0 - 15.0 g/dL   HCT 95.1 (*) 88.4 - 16.6 %   No results found.  Assessment and Plan: 38 y.o. female with UTI symptoms with normal-appearing dilute urine. Urine culture is pending. Patient has a lot of experience with UTIs. I feel that she is likely right. Will treat with Keflex while awaiting urine culture results. Recommend patient followup with her primary care provider. Discussed warning signs or symptoms. Please see discharge instructions. Patient expresses understanding.      Rodolph Bong, MD 05/20/13 [phone]

## 2013-05-21 LAB — URINE CULTURE
Colony Count: >=100000
Special Requests: NORMAL

## 2016-06-21 ENCOUNTER — Other Ambulatory Visit: Payer: Self-pay | Admitting: Obstetrics and Gynecology

## 2016-06-22 ENCOUNTER — Other Ambulatory Visit: Payer: Self-pay | Admitting: Obstetrics and Gynecology

## 2016-06-22 DIAGNOSIS — R928 Other abnormal and inconclusive findings on diagnostic imaging of breast: Secondary | ICD-10-CM

## 2016-06-22 LAB — CYTOLOGY - PAP

## 2016-06-28 ENCOUNTER — Ambulatory Visit
Admission: RE | Admit: 2016-06-28 | Discharge: 2016-06-28 | Disposition: A | Discharge status: Home / Self Care | Payer: PRIVATE HEALTH INSURANCE | Source: Ambulatory Visit | Attending: Obstetrics and Gynecology | Admitting: Obstetrics and Gynecology

## 2016-06-28 DIAGNOSIS — R928 Other abnormal and inconclusive findings on diagnostic imaging of breast: Secondary | ICD-10-CM

## 2016-06-28 HISTORY — DX: Personal history of antineoplastic chemotherapy: Z92.21

## 2016-11-22 ENCOUNTER — Other Ambulatory Visit: Payer: Self-pay | Admitting: Obstetrics and Gynecology

## 2016-11-22 DIAGNOSIS — N63 Unspecified lump in unspecified breast: Secondary | ICD-10-CM

## 2016-12-26 ENCOUNTER — Other Ambulatory Visit: Payer: Self-pay | Admitting: Obstetrics and Gynecology

## 2016-12-26 ENCOUNTER — Ambulatory Visit
Admission: RE | Admit: 2016-12-26 | Discharge: 2016-12-26 | Disposition: A | Discharge status: Home / Self Care | Payer: PRIVATE HEALTH INSURANCE | Source: Ambulatory Visit | Attending: Obstetrics and Gynecology | Admitting: Obstetrics and Gynecology

## 2016-12-26 DIAGNOSIS — N63 Unspecified lump in unspecified breast: Secondary | ICD-10-CM

## 2016-12-26 DIAGNOSIS — N6002 Solitary cyst of left breast: Secondary | ICD-10-CM

## 2017-07-02 ENCOUNTER — Ambulatory Visit
Admission: RE | Admit: 2017-07-02 | Discharge: 2017-07-02 | Disposition: A | Discharge status: Home / Self Care | Payer: PRIVATE HEALTH INSURANCE | Source: Ambulatory Visit | Attending: Obstetrics and Gynecology | Admitting: Obstetrics and Gynecology

## 2017-07-02 DIAGNOSIS — N6002 Solitary cyst of left breast: Secondary | ICD-10-CM

## 2018-04-10 ENCOUNTER — Other Ambulatory Visit: Payer: Self-pay | Admitting: Orthopaedic Surgery

## 2018-04-10 DIAGNOSIS — M19079 Primary osteoarthritis, unspecified ankle and foot: Secondary | ICD-10-CM

## 2018-04-15 ENCOUNTER — Ambulatory Visit
Admission: RE | Admit: 2018-04-15 | Discharge: 2018-04-15 | Disposition: A | Discharge status: Home / Self Care | Payer: PRIVATE HEALTH INSURANCE | Source: Ambulatory Visit | Attending: Orthopaedic Surgery | Admitting: Orthopaedic Surgery

## 2018-04-15 DIAGNOSIS — M19079 Primary osteoarthritis, unspecified ankle and foot: Secondary | ICD-10-CM

## 2018-05-29 ENCOUNTER — Other Ambulatory Visit: Payer: Self-pay | Admitting: Obstetrics and Gynecology

## 2018-05-29 DIAGNOSIS — N6320 Unspecified lump in the left breast, unspecified quadrant: Secondary | ICD-10-CM

## 2018-07-04 ENCOUNTER — Ambulatory Visit: Payer: PRIVATE HEALTH INSURANCE

## 2018-07-04 ENCOUNTER — Ambulatory Visit
Admission: RE | Admit: 2018-07-04 | Discharge: 2018-07-04 | Disposition: A | Discharge status: Home / Self Care | Payer: PRIVATE HEALTH INSURANCE | Source: Ambulatory Visit | Attending: Obstetrics and Gynecology | Admitting: Obstetrics and Gynecology

## 2018-07-04 DIAGNOSIS — N6320 Unspecified lump in the left breast, unspecified quadrant: Secondary | ICD-10-CM

## 2018-10-30 ENCOUNTER — Other Ambulatory Visit: Payer: Self-pay | Admitting: Orthopaedic Surgery

## 2018-10-30 DIAGNOSIS — M960 Pseudarthrosis after fusion or arthrodesis: Secondary | ICD-10-CM

## 2018-11-11 ENCOUNTER — Ambulatory Visit
Admission: RE | Admit: 2018-11-11 | Discharge: 2018-11-11 | Disposition: A | Discharge status: Home / Self Care | Payer: Commercial Managed Care - PPO | Source: Ambulatory Visit | Attending: Orthopaedic Surgery | Admitting: Orthopaedic Surgery

## 2018-11-11 DIAGNOSIS — M960 Pseudarthrosis after fusion or arthrodesis: Secondary | ICD-10-CM

## 2018-12-10 ENCOUNTER — Other Ambulatory Visit: Payer: Self-pay | Admitting: Orthopaedic Surgery

## 2018-12-11 ENCOUNTER — Encounter (HOSPITAL_BASED_OUTPATIENT_CLINIC_OR_DEPARTMENT_OTHER): Payer: Self-pay | Admitting: *Deleted

## 2018-12-11 ENCOUNTER — Other Ambulatory Visit: Payer: Self-pay

## 2018-12-15 ENCOUNTER — Other Ambulatory Visit (HOSPITAL_COMMUNITY)
Admission: RE | Admit: 2018-12-15 | Discharge: 2018-12-15 | Disposition: A | Discharge status: Home / Self Care | Payer: Commercial Managed Care - PPO | Source: Ambulatory Visit | Attending: Orthopaedic Surgery | Admitting: Orthopaedic Surgery

## 2018-12-15 ENCOUNTER — Encounter (HOSPITAL_BASED_OUTPATIENT_CLINIC_OR_DEPARTMENT_OTHER)
Admission: RE | Admit: 2018-12-15 | Discharge: 2018-12-15 | Disposition: A | Discharge status: Home / Self Care | Payer: Commercial Managed Care - PPO | Source: Ambulatory Visit

## 2018-12-15 ENCOUNTER — Other Ambulatory Visit: Payer: Self-pay

## 2018-12-15 DIAGNOSIS — I10 Essential (primary) hypertension: Secondary | ICD-10-CM | POA: Insufficient documentation

## 2018-12-15 DIAGNOSIS — Z01810 Encounter for preprocedural cardiovascular examination: Secondary | ICD-10-CM | POA: Insufficient documentation

## 2018-12-15 DIAGNOSIS — Z20828 Contact with and (suspected) exposure to other viral communicable diseases: Secondary | ICD-10-CM | POA: Insufficient documentation

## 2018-12-15 LAB — SARS CORONAVIRUS 2 (TAT 6-24 HRS): SARS Coronavirus 2: NEGATIVE

## 2018-12-15 NOTE — Progress Notes (Signed)
Ensure pre surgical drink given to pt with instructions to finish 0630 DOS. Pt verbalized understanding

## 2018-12-17 NOTE — Anesthesia Preprocedure Evaluation (Addendum)
Anesthesia Evaluation  Patient identified by MRN, date of birth, ID band Patient awake    Reviewed: Allergy & Precautions, NPO status , Patient's Chart, lab work & pertinent test results  History of Anesthesia Complications Negative for: history of anesthetic complications  Airway Mallampati: II  TM Distance: >3 FB Neck ROM: Full    Dental no notable dental hx.    Pulmonary former smoker,    Pulmonary exam normal        Cardiovascular hypertension, Pt. on medications Normal cardiovascular exam     Neuro/Psych Anxiety Depression negative neurological ROS     GI/Hepatic negative GI ROS, Neg liver ROS,   Endo/Other  negative endocrine ROS  Renal/GU negative Renal ROS     Musculoskeletal negative musculoskeletal ROS (+)   Abdominal   Peds  Hematology negative hematology ROS (+)   Anesthesia Other Findings Day of surgery medications reviewed with the patient.  Reproductive/Obstetrics                            Anesthesia Physical Anesthesia Plan  ASA: II  Anesthesia Plan: General   Post-op Pain Management: GA combined w/ Regional for post-op pain   Induction: Intravenous  PONV Risk Score and Plan: 2 and Treatment may vary due to age or medical condition, Midazolam, Ondansetron and Dexamethasone  Airway Management Planned: LMA  Additional Equipment: None  Intra-op Plan:   Post-operative Plan: Extubation in OR  Informed Consent: I have reviewed the patients History and Physical, chart, labs and discussed the procedure including the risks, benefits and alternatives for the proposed anesthesia with the patient or authorized representative who has indicated his/her understanding and acceptance.     Dental advisory given  Plan Discussed with: CRNA  Anesthesia Plan Comments:       Anesthesia Quick Evaluation

## 2018-12-18 ENCOUNTER — Encounter (HOSPITAL_BASED_OUTPATIENT_CLINIC_OR_DEPARTMENT_OTHER): Payer: Self-pay | Admitting: *Deleted

## 2018-12-18 ENCOUNTER — Ambulatory Visit (HOSPITAL_BASED_OUTPATIENT_CLINIC_OR_DEPARTMENT_OTHER)
Admission: RE | Admit: 2018-12-18 | Discharge: 2018-12-18 | Disposition: A | Discharge status: Home / Self Care | Payer: Commercial Managed Care - PPO | Attending: Orthopaedic Surgery | Admitting: Orthopaedic Surgery

## 2018-12-18 ENCOUNTER — Encounter (HOSPITAL_BASED_OUTPATIENT_CLINIC_OR_DEPARTMENT_OTHER)
Admission: RE | Disposition: A | Discharge status: Home / Self Care | Payer: Self-pay | Source: Home / Self Care | Attending: Orthopaedic Surgery

## 2018-12-18 ENCOUNTER — Ambulatory Visit (HOSPITAL_BASED_OUTPATIENT_CLINIC_OR_DEPARTMENT_OTHER): Payer: Commercial Managed Care - PPO | Admitting: Anesthesiology

## 2018-12-18 ENCOUNTER — Other Ambulatory Visit: Payer: Self-pay

## 2018-12-18 DIAGNOSIS — I10 Essential (primary) hypertension: Secondary | ICD-10-CM | POA: Insufficient documentation

## 2018-12-18 DIAGNOSIS — Z79899 Other long term (current) drug therapy: Secondary | ICD-10-CM | POA: Diagnosis not present

## 2018-12-18 DIAGNOSIS — Z791 Long term (current) use of non-steroidal anti-inflammatories (NSAID): Secondary | ICD-10-CM | POA: Insufficient documentation

## 2018-12-18 DIAGNOSIS — Y831 Surgical operation with implant of artificial internal device as the cause of abnormal reaction of the patient, or of later complication, without mention of misadventure at the time of the procedure: Secondary | ICD-10-CM | POA: Insufficient documentation

## 2018-12-18 DIAGNOSIS — T8484XA Pain due to internal orthopedic prosthetic devices, implants and grafts, initial encounter: Secondary | ICD-10-CM | POA: Diagnosis not present

## 2018-12-18 DIAGNOSIS — Z9221 Personal history of antineoplastic chemotherapy: Secondary | ICD-10-CM | POA: Insufficient documentation

## 2018-12-18 DIAGNOSIS — Z87891 Personal history of nicotine dependence: Secondary | ICD-10-CM | POA: Insufficient documentation

## 2018-12-18 HISTORY — PX: HARDWARE REMOVAL: SHX979

## 2018-12-18 LAB — POCT PREGNANCY, URINE: Preg Test, Ur: NEGATIVE

## 2018-12-18 SURGERY — REMOVAL, HARDWARE
Anesthesia: Monitor Anesthesia Care | Site: Foot | Laterality: Left

## 2018-12-18 MED ORDER — MIDAZOLAM HCL 2 MG/2ML IJ SOLN
1.0000 mg | INTRAMUSCULAR | Status: DC | PRN
  Administered 2018-12-18: 10:00:00 2 mg via INTRAVENOUS
  Administered 2018-12-18: 1 mg via INTRAVENOUS

## 2018-12-18 MED ORDER — CLINDAMYCIN PHOSPHATE 900 MG/50ML IV SOLN
INTRAVENOUS | Status: AC
  Filled 2018-12-18: qty 50

## 2018-12-18 MED ORDER — CLINDAMYCIN PHOSPHATE 900 MG/50ML IV SOLN
900.0000 mg | INTRAVENOUS | Status: AC
  Administered 2018-12-18: 10:00:00 900 mg via INTRAVENOUS

## 2018-12-18 MED ORDER — OXYCODONE HCL 5 MG PO TABS
ORAL_TABLET | ORAL | Status: AC
  Filled 2018-12-18: qty 1

## 2018-12-18 MED ORDER — OXYCODONE HCL 5 MG PO TABS
5.0000 mg | ORAL_TABLET | Freq: Once | ORAL | Status: AC | PRN
  Administered 2018-12-18: 5 mg via ORAL

## 2018-12-18 MED ORDER — ACETAMINOPHEN 10 MG/ML IV SOLN
1000.0000 mg | Freq: Once | INTRAVENOUS | Status: DC | PRN
  Administered 2018-12-18: 1000 mg via INTRAVENOUS

## 2018-12-18 MED ORDER — PROPOFOL 10 MG/ML IV BOLUS
INTRAVENOUS | Status: DC | PRN
  Administered 2018-12-18: 100 mg via INTRAVENOUS

## 2018-12-18 MED ORDER — LIDOCAINE 2% (20 MG/ML) 5 ML SYRINGE
INTRAMUSCULAR | Status: AC
  Filled 2018-12-18: qty 5

## 2018-12-18 MED ORDER — OXYCODONE HCL 5 MG PO TABS: 5.0000 mg | ORAL_TABLET | ORAL | 0 refills | Status: AC | PRN

## 2018-12-18 MED ORDER — BUPIVACAINE HCL 0.5 % IJ SOLN
INTRAMUSCULAR | Status: DC | PRN
  Administered 2018-12-18: 10 mL

## 2018-12-18 MED ORDER — PROPOFOL 10 MG/ML IV BOLUS
INTRAVENOUS | Status: AC
  Filled 2018-12-18: qty 40

## 2018-12-18 MED ORDER — DEXAMETHASONE SODIUM PHOSPHATE 10 MG/ML IJ SOLN
INTRAMUSCULAR | Status: AC
  Filled 2018-12-18: qty 1

## 2018-12-18 MED ORDER — FENTANYL CITRATE (PF) 100 MCG/2ML IJ SOLN: 25.0000 ug | INTRAMUSCULAR | Status: DC | PRN

## 2018-12-18 MED ORDER — LACTATED RINGERS IV SOLN
INTRAVENOUS | Status: DC
  Administered 2018-12-18: 09:00:00 via INTRAVENOUS

## 2018-12-18 MED ORDER — MIDAZOLAM HCL 2 MG/2ML IJ SOLN
INTRAMUSCULAR | Status: AC
  Filled 2018-12-18: qty 2

## 2018-12-18 MED ORDER — CHLORHEXIDINE GLUCONATE 4 % EX LIQD: 60.0000 mL | Freq: Once | CUTANEOUS | Status: DC

## 2018-12-18 MED ORDER — ONDANSETRON HCL 4 MG/2ML IJ SOLN
INTRAMUSCULAR | Status: DC | PRN
  Administered 2018-12-18: 4 mg via INTRAVENOUS

## 2018-12-18 MED ORDER — PROMETHAZINE HCL 25 MG/ML IJ SOLN: 6.2500 mg | INTRAMUSCULAR | Status: DC | PRN

## 2018-12-18 MED ORDER — PROPOFOL 500 MG/50ML IV EMUL
INTRAVENOUS | Status: DC | PRN
  Administered 2018-12-18: 200 ug/kg/min via INTRAVENOUS

## 2018-12-18 MED ORDER — OXYCODONE HCL 5 MG/5ML PO SOLN: 5.0000 mg | Freq: Once | ORAL | Status: AC | PRN

## 2018-12-18 MED ORDER — BUPIVACAINE HCL (PF) 0.5 % IJ SOLN
INTRAMUSCULAR | Status: AC
  Filled 2018-12-18: qty 30

## 2018-12-18 MED ORDER — FENTANYL CITRATE (PF) 100 MCG/2ML IJ SOLN
INTRAMUSCULAR | Status: AC
  Filled 2018-12-18: qty 2

## 2018-12-18 MED ORDER — ONDANSETRON HCL 4 MG/2ML IJ SOLN
INTRAMUSCULAR | Status: AC
  Filled 2018-12-18: qty 2

## 2018-12-18 MED ORDER — LIDOCAINE HCL (CARDIAC) PF 100 MG/5ML IV SOSY
PREFILLED_SYRINGE | INTRAVENOUS | Status: DC | PRN
  Administered 2018-12-18: 60 mg via INTRAVENOUS

## 2018-12-18 MED ORDER — ACETAMINOPHEN 10 MG/ML IV SOLN
INTRAVENOUS | Status: AC
  Filled 2018-12-18: qty 100

## 2018-12-18 MED ORDER — BUPIVACAINE HCL (PF) 0.5 % IJ SOLN
INTRAMUSCULAR | Status: DC | PRN
  Administered 2018-12-18: 30 mL via PERINEURAL

## 2018-12-18 MED ORDER — FENTANYL CITRATE (PF) 100 MCG/2ML IJ SOLN
50.0000 ug | INTRAMUSCULAR | Status: AC | PRN
  Administered 2018-12-18 (×2): 25 ug via INTRAVENOUS
  Administered 2018-12-18 (×2): 50 ug via INTRAVENOUS

## 2018-12-18 MED ORDER — POVIDONE-IODINE 10 % EX SWAB: 2.0000 "application " | Freq: Once | CUTANEOUS | Status: DC

## 2018-12-18 SURGICAL SUPPLY — 65 items
APL PRP STRL LF DISP 70% ISPRP (MISCELLANEOUS) ×1
APL SKNCLS STERI-STRIP NONHPOA (GAUZE/BANDAGES/DRESSINGS)
BANDAGE ESMARK 6X9 LF (GAUZE/BANDAGES/DRESSINGS) ×1 IMPLANT
BENZOIN TINCTURE PRP APPL 2/3 (GAUZE/BANDAGES/DRESSINGS) IMPLANT
BLADE SURG 15 STRL LF DISP TIS (BLADE) ×2 IMPLANT
BLADE SURG 15 STRL SS (BLADE) ×4
BNDG CMPR 9X4 STRL LF SNTH (GAUZE/BANDAGES/DRESSINGS) ×1
BNDG CMPR 9X6 STRL LF SNTH (GAUZE/BANDAGES/DRESSINGS) ×1
BNDG ELASTIC 4X5.8 VLCR STR LF (GAUZE/BANDAGES/DRESSINGS) IMPLANT
BNDG ELASTIC 6X5.8 VLCR STR LF (GAUZE/BANDAGES/DRESSINGS) ×2 IMPLANT
BNDG ESMARK 4X9 LF (GAUZE/BANDAGES/DRESSINGS) ×1 IMPLANT
BNDG ESMARK 6X9 LF (GAUZE/BANDAGES/DRESSINGS) ×2
CHLORAPREP W/TINT 26 (MISCELLANEOUS) ×2 IMPLANT
COVER BACK TABLE REUSABLE LG (DRAPES) ×2 IMPLANT
COVER WAND RF STERILE (DRAPES) IMPLANT
CUFF TOURN SGL QUICK 34 (TOURNIQUET CUFF)
CUFF TRNQT CYL 34X4.125X (TOURNIQUET CUFF) IMPLANT
DECANTER SPIKE VIAL GLASS SM (MISCELLANEOUS) IMPLANT
DRAPE C-ARM 42X72 X-RAY (DRAPES) ×2 IMPLANT
DRAPE EXTREMITY T 121X128X90 (DISPOSABLE) ×2 IMPLANT
DRAPE IMP U-DRAPE 54X76 (DRAPES) ×2 IMPLANT
DRAPE U-SHAPE 47X51 STRL (DRAPES) ×2 IMPLANT
DRSG PAD ABDOMINAL 8X10 ST (GAUZE/BANDAGES/DRESSINGS) ×2 IMPLANT
ELECT REM PT RETURN 9FT ADLT (ELECTROSURGICAL) ×2
ELECTRODE REM PT RTRN 9FT ADLT (ELECTROSURGICAL) ×1 IMPLANT
GAUZE SPONGE 4X4 12PLY STRL (GAUZE/BANDAGES/DRESSINGS) ×2 IMPLANT
GAUZE XEROFORM 1X8 LF (GAUZE/BANDAGES/DRESSINGS) ×2 IMPLANT
GLOVE BIOGEL M STRL SZ7.5 (GLOVE) ×2 IMPLANT
GLOVE BIOGEL PI IND STRL 7.0 (GLOVE) IMPLANT
GLOVE BIOGEL PI IND STRL 8 (GLOVE) ×1 IMPLANT
GLOVE BIOGEL PI INDICATOR 7.0 (GLOVE) ×1
GLOVE BIOGEL PI INDICATOR 8 (GLOVE) ×2
GLOVE ECLIPSE 6.5 STRL STRAW (GLOVE) ×1 IMPLANT
GOWN STRL REUS W/ TWL LRG LVL3 (GOWN DISPOSABLE) ×1 IMPLANT
GOWN STRL REUS W/ TWL XL LVL3 (GOWN DISPOSABLE) ×1 IMPLANT
GOWN STRL REUS W/TWL LRG LVL3 (GOWN DISPOSABLE) ×2
GOWN STRL REUS W/TWL XL LVL3 (GOWN DISPOSABLE) ×2
NDL HYPO 25X1 1.5 SAFETY (NEEDLE) IMPLANT
NEEDLE HYPO 25X1 1.5 SAFETY (NEEDLE) IMPLANT
NS IRRIG 1000ML POUR BTL (IV SOLUTION) ×2 IMPLANT
PACK BASIN DAY SURGERY FS (CUSTOM PROCEDURE TRAY) ×2 IMPLANT
PAD CAST 4YDX4 CTTN HI CHSV (CAST SUPPLIES) ×1 IMPLANT
PADDING CAST COTTON 4X4 STRL (CAST SUPPLIES) ×2
PADDING CAST SYNTHETIC 4 (CAST SUPPLIES)
PADDING CAST SYNTHETIC 4X4 STR (CAST SUPPLIES) IMPLANT
PENCIL BUTTON HOLSTER BLD 10FT (ELECTRODE) ×2 IMPLANT
SLEEVE SCD COMPRESS KNEE MED (MISCELLANEOUS) ×2 IMPLANT
SPLINT FAST PLASTER 5X30 (CAST SUPPLIES)
SPLINT PLASTER CAST FAST 5X30 (CAST SUPPLIES) IMPLANT
SPONGE LAP 18X18 RF (DISPOSABLE) IMPLANT
STOCKINETTE 6  STRL (DRAPES) ×1
STOCKINETTE 6 STRL (DRAPES) ×1 IMPLANT
STRIP CLOSURE SKIN 1/2X4 (GAUZE/BANDAGES/DRESSINGS) IMPLANT
SUCTION FRAZIER HANDLE 10FR (MISCELLANEOUS) ×1
SUCTION TUBE FRAZIER 10FR DISP (MISCELLANEOUS) ×1 IMPLANT
SUT BONE WAX W31G (SUTURE) ×1 IMPLANT
SUT ETHILON 3 0 PS 1 (SUTURE) IMPLANT
SUT MNCRL AB 3-0 PS2 18 (SUTURE) ×2 IMPLANT
SUT PDS AB 2-0 CT2 27 (SUTURE) IMPLANT
SUT VIC AB 3-0 FS2 27 (SUTURE) IMPLANT
SYR BULB 3OZ (MISCELLANEOUS) ×2 IMPLANT
SYR CONTROL 10ML LL (SYRINGE) IMPLANT
TOWEL GREEN STERILE FF (TOWEL DISPOSABLE) ×4 IMPLANT
TUBE CONNECTING 20X1/4 (TUBING) ×2 IMPLANT
UNDERPAD 30X30 (UNDERPADS AND DIAPERS) ×2 IMPLANT

## 2018-12-18 NOTE — Anesthesia Postprocedure Evaluation (Signed)
Anesthesia Post Note  Patient: Jasmine Sexton  Procedure(s) Performed: DEEP HARDWARE REMOVAL LEFT FOOT (Left Foot)     Patient location during evaluation: PACU Anesthesia Type: General Level of consciousness: awake and alert and oriented Pain management: pain level controlled Vital Signs Assessment: post-procedure vital signs reviewed and stable Respiratory status: spontaneous breathing, nonlabored ventilation and respiratory function stable Cardiovascular status: blood pressure returned to baseline Postop Assessment: no apparent nausea or vomiting Anesthetic complications: no    Last Vitals:  Vitals:   12/18/18 1145 12/18/18 1200  BP:  121/67  Pulse: 66 68  Resp:  16  Temp: 36.5 C   SpO2: 100% 99%    Last Pain:  Vitals:   12/18/18 1200  TempSrc:   PainSc: [place]

## 2018-12-18 NOTE — Progress Notes (Signed)
Assisted Dr. Howze with left, ultrasound guided, ankle block. Side rails up, monitors on throughout procedure. See vital signs in flow sheet. Tolerated Procedure well. 

## 2018-12-18 NOTE — Transfer of Care (Signed)
Immediate Anesthesia Transfer of Care Note  Patient: Jasmine Sexton  Procedure(s) Performed: DEEP HARDWARE REMOVAL LEFT FOOT (Left Foot)  Patient Location: PACU  Anesthesia Type:General and Regional  Level of Consciousness: awake, alert  and oriented  Airway & Oxygen Therapy: Patient Spontanous Breathing and Patient connected to face mask oxygen  Post-op Assessment: Report given to RN and Post -op Vital signs reviewed and stable  Post vital signs: Reviewed and stable  Last Vitals:  Vitals Value Taken Time  BP    Temp    Pulse 73 12/18/18 1110  Resp 13 12/18/18 1110  SpO2 100 % 12/18/18 1110  Vitals shown include unvalidated device data.  Last Pain:  Vitals:   12/18/18 0930  TempSrc:   PainSc: 0-No pain         Complications: No apparent anesthesia complications

## 2018-12-18 NOTE — Anesthesia Procedure Notes (Signed)
Procedure Name: LMA Insertion Performed by: Daman Steffenhagen M, CRNA Pre-anesthesia Checklist: Patient identified, Emergency Drugs available, Suction available, Patient being monitored and Timeout performed Patient Re-evaluated:Patient Re-evaluated prior to induction Oxygen Delivery Method: Circle system utilized Preoxygenation: Pre-oxygenation with 100% oxygen Induction Type: IV induction LMA: LMA inserted LMA Size: 4.0 Tube type: Oral Number of attempts: 1 Placement Confirmation: positive ETCO2,  CO2 detector and breath sounds checked- equal and bilateral Tube secured with: Tape Dental Injury: Teeth and Oropharynx as per pre-operative assessment        

## 2018-12-18 NOTE — Op Note (Signed)
Jasmine Sexton female 43 y.o. 12/18/2018  PreOperative Diagnosis: Left foot symptomatic orthopedic hardware  PostOperative Diagnosis: Same  PROCEDURE: Removal of deep implant, left foot  SURGEON: Melony Overly, MD  ASSISTANT: None  ANESTHESIA: General LMA anesthesia with ankle block  FINDINGS: Retained orthopedic hardware  IMPLANTS: None  INDICATIONS:43 y.o. female had a second third TMT arthrodesis approximately 6 months ago.  She had symptoms related to her hardware and therefore was indicated for hardware removal.  She is otherwise healthy.  We discussed the risk benefits alternatives which include but are not limited to wound healing complications, infection, continued pain, need for further surgery demonstrating structures.  After weighing these risks he opted proceed with surgery.  PROCEDURE:Patient was identified in the preoperative holding area.  The left leg was marked myself.  The consent was signed myself and the patient.  Peripheral nerve blockade was performed by anesthesia.  She was taken the operative suite placed supine operative table.  General LMA anesthesia was induced without difficulty.  Preoperative antibiotics were given.  All bony prominences were well-padded.  The left lower extremity was prepped and draped in the usual sterile fashion.  Surgical timeout was performed.  4 inch Esmarch ankle tourniquet was placed.  We began by making a longitudinal incision overlying the previous scar centered between the second third TMT joints.  This taken sharply down through skin subcutaneous tissue.  There is scar formation there.  Tissue flaps were created medially and laterally.  Then the extensor digitorum brevis muscle belly was identified mobilized medially.  We then able to identify the more medial staple at the level of the second TMT underneath the extensor digitorum muscle.  The soft tissue was mobilized overlying the staple and the staplers were removed using  a Freer, he osteotome and pliers.  It was quite ingrown to the bone difficult to remove but it came out without incident.  We then created a soft tissue flap laterally to expose the other staple at the third TMT joint.  We created a deep tissue flap overlying the staple and this was identified.  The staple was cut with a wire cutter and all tines were removed.  Again this was quite ingrown and difficult to remove all pieces were removed ultimately.  After this the area of the arthrodesis site was checked and found to be solidly arthrodesed.  Then the tourniquet was removed and hemostasis was obtained.  A small amount of bone wax was placed at the time spots within the metatarsal and cuneiform bones.  Then the wound was irrigated with normal saline.  The deep tissue was closed with a 3-0 Monocryl stitch.  The skin was closed with a 3-0 nylon.  At the surgical site we infiltrated 10 cc of half percent Marcaine plain for local anesthesia despite that nerve block as it seemed during the surgery that the nerve block was inadequate.  Xeroform was placed on the wound.  A soft dressing and a 4 inch Ace wrap was placed.  She tolerated the procedure well.  There were no complications.  Counts were correct at the end the case.  She was taken recovery in stable condition.  POST OPERATIVE INSTRUCTIONS: Weightbearing as tolerated left lower extremity in a postoperative shoe until able to tolerate regular shoe wear. Keep dressing in place until follow-up Follow-up in 2 weeks for wound check and suture removal No x-rays needed at follow-up.  BLOOD LOSS:  Minimal         DRAINS: none  SPECIMEN: none       COMPLICATIONS:  * No complications entered in OR log *         Disposition: PACU - hemodynamically stable.         Condition: stable

## 2018-12-18 NOTE — Discharge Instructions (Signed)
DR. Lucia Gaskins FOOT [facility] ANKLE SURGERY POST-OP INSTRUCTIONS   Pain Management 1. The numbing medicine and your leg will last around 8 hours, take a dose of your pain medicine as soon as you feel it wearing off to avoid rebound pain. 2. Keep your foot elevated above heart level.  Make sure that your heel hangs free ('floats'). 3. Take all prescribed medication as directed. 4. If taking narcotic pain medication you may want to use an over-the-counter stool softener to avoid constipation. 5. You may take over-the-counter NSAIDs (ibuprofen, naproxen, etc.) as well as over-the-counter acetaminophen as directed on the packaging as a supplement for your pain and may also use it to wean away from the prescription medication.  Activity ? Weightbearing as tolerated in a boot   First Postoperative Visit 1. Your first postop visit will be at least 2 weeks after surgery.  This should be scheduled when you schedule surgery. 2. If you do not have a postoperative visit scheduled please call [phone] to schedule an appointment. 3. At the appointment your incision will be evaluated for suture removal, x-rays will be obtained if necessary.  General Instructions 1. Swelling is very common after foot and ankle surgery.  It often takes 3 months for the foot and ankle to begin to feel comfortable.  Some amount of swelling will persist for 6-12 months. 2. DO NOT change the dressing.  If there is a problem with the dressing (too tight, loose, gets wet, etc.) please contact Dr. Pollie Friar office. 3. DO NOT get the dressing wet.  For showers you can use an over-the-counter cast cover or wrap a washcloth around the top of your dressing and then cover it with a plastic bag and tape it to your leg. 4. DO NOT soak the incision (no tubs, pools, bath, etc.) until you have approval from Dr. Lucia Gaskins.  Contact Dr. Huel Cote office or go to Emergency Room if: 1. Temperature above 101 F. 2. Increasing pain that is unresponsive to pain  medication or elevation 3. Excessive redness or swelling in your foot 4. Dressing problems - excessive bloody drainage, looseness or tightness, or if dressing gets wet 5. Develop pain, swelling, warmth, or discoloration of your calf   Regional Anesthesia Blocks  1. Numbness or the inability to move the "blocked" extremity may last from 3-48 hours after placement. The length of time depends on the medication injected and your individual response to the medication. If the numbness is not going away after 48 hours, call your surgeon.  2. The extremity that is blocked will need to be protected until the numbness is gone and the  Strength has returned. Because you cannot feel it, you will need to take extra care to avoid injury. Because it may be weak, you may have difficulty moving it or using it. You may not know what position it is in without looking at it while the block is in effect.  3. For blocks in the legs and feet, returning to weight bearing and walking needs to be done carefully. You will need to wait until the numbness is entirely gone and the strength has returned. You should be able to move your leg and foot normally before you try and bear weight or walk. You will need someone to be with you when you first try to ensure you do not fall and possibly risk injury.  4. Bruising and tenderness at the needle site are common side effects and will resolve in a few days.  5.  Persistent numbness or new problems with movement should be communicated to the surgeon or the [facility] [phone])/ [facility] [phone]).

## 2018-12-18 NOTE — H&P (Signed)
Jasmine LangoColleen M Sexton is an 43 y.o. female.   Chief Complaint: Left foot symptomatic orthopedic hardware HPI: Jasmine Sexton is here today for hardware removal.  She had a second and third TMT arthrodesis nearly 6 months ago and is developed pain due to her hardware.  She was seen in the clinic we discussed continued conservative treatment she opted for hardware removal.  She continues to have intermittent moderately intense pain in the dorsal aspect of her foot.  She is been wearing sneakers which have helped somewhat but it has been limiting her activities.  She has not had any recent fevers or chills.  Denies any other joint or extremity pain.  Past Medical History:  Diagnosis Date  . Anxiety and depression   . Chest pain    Stress echocardiogram June 2009: Normal LV function; no ischemia  . Hypertension Diagnosed August 2011  . 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 Bilateral tubal ligation  2011  . Laparoscopic surgery  1992 and 1993  . SHOULDER SURGERY  1996    Family History  Problem Relation Age of Onset  . Heart attack Father [id number]  . Heart attack Maternal Grandfather   . Breast cancer Maternal Aunt    Social History:  reports that she has quit smoking. She has never used smokeless tobacco. She reports that she does not drink alcohol or use drugs.  Allergies:  Allergies  Allergen Reactions  . Amoxicillin   . Aspirin   . Codeine   . Demerol   . Erythromycin   . Penicillins     Medications Prior to Admission  Medication Sig Dispense Refill  . celecoxib (CELEBREX) 100 MG capsule Take 100 mg by mouth 2 (two) times daily.    Marland Kitchen. lisinopril-hydrochlorothiazide (ZESTORETIC) 20-25 MG tablet Take 1 tablet by mouth daily.    . traMADol (ULTRAM) 50 MG tablet Take 50 mg by mouth every 6 (six) hours as needed.      Results for orders placed or performed during the hospital encounter of 12/18/18 (from the past  48 hour(s))  Pregnancy, urine POC     Status: None   Collection Time: 12/18/18  8:49 AM  Result Value Ref Range   Preg Test, Ur NEGATIVE NEGATIVE    Comment:        THE SENSITIVITY OF THIS METHODOLOGY IS >24 mIU/mL    No results found.  Review of Systems  Constitutional: Negative.   HENT: Negative.   Eyes: Negative.   Respiratory: Negative.   Cardiovascular: Negative.   Gastrointestinal: Negative.   Musculoskeletal:       Left foot pain  Skin: Negative.   Neurological: Negative.   Psychiatric/Behavioral: Negative.     Blood pressure 105/68, pulse 80, temperature 97.9 F (36.6 C), temperature source Oral, resp. rate 16, height 5\' 6"  (1.676 m), weight 96.8 kg, SpO2 100 %. Physical Exam  HENT:  Head: Normocephalic.  Eyes: Conjunctivae are normal.  Cardiovascular: Normal rate.  Respiratory: Effort normal.  GI: Soft.  Musculoskeletal:     Comments: Left foot demonstrates well-healed surgical incision dorsally.  There is tenderness palpation about the incision.  No significant swelling.  No tenderness proximal to the incision.  Ankle dorsiflexion plantarflexion intact without pain.  No forefoot tenderness.  Sensation intact in the dorsal and plantar foot.  Foot is warm and well-perfused with palpable dorsalis pedis pulse.  Neurological: She is alert.  Skin: Skin is  warm.  Psychiatric: She has a normal mood and affect.     Assessment/Plan We will proceed with hardware removal of the left foot.  She understands the risk, benefits alternatives surgery which include but are not limited to wound healing complications, infection, damage surrounding structures, continued pain.  We also discussed the perioperative and anesthetic risk which include death.  She understands the postoperative weightbearing restrictions and agrees to comply.  Plan will be for discharge from the PACU.     Erle Crocker, MD 12/18/2018, 9:45 AM

## 2018-12-18 NOTE — Anesthesia Procedure Notes (Signed)
Anesthesia Regional Block: Ankle block   Pre-Anesthetic Checklist: ,, timeout performed, Correct Patient, Correct Site, Correct Laterality, Correct Procedure, Correct Position, site marked, Risks and benefits discussed, pre-op evaluation,  At surgeon's request and post-op pain management  Laterality: Left  Prep: Maximum Sterile Barrier Precautions used, chloraprep       Needles:  Injection technique: Single-shot  Needle Type: Echogenic Needle     Needle Length: 4cm  Needle Gauge: 25     Additional Needles:   Narrative:  Start time: 12/18/2018 9:27 AM End time: 12/18/2018 9:30 AM  Performed by: Personally  Anesthesiologist: Brennan Bailey, MD  Additional Notes: Risks, benefits, and alternative discussed. Patient gave consent for procedure. Patient prepped and draped in sterile fashion. Sedation administered, patient remains easily responsive to voice. Local anesthetic given in 5cc increments with no signs or symptoms of intravascular injection. No pain or paraesthesias with injection. Patient monitored throughout procedure with signs of LAST or immediate complications. Tolerated well.   Tawny Asal, MD

## 2018-12-19 ENCOUNTER — Encounter (HOSPITAL_BASED_OUTPATIENT_CLINIC_OR_DEPARTMENT_OTHER): Payer: Self-pay | Admitting: Orthopaedic Surgery

## 2021-03-29 ENCOUNTER — Other Ambulatory Visit: Payer: Self-pay | Admitting: Orthopedic Surgery

## 2021-03-29 DIAGNOSIS — M25512 Pain in left shoulder: Secondary | ICD-10-CM

## 2021-03-29 DIAGNOSIS — M25522 Pain in left elbow: Secondary | ICD-10-CM

## 2021-04-20 ENCOUNTER — Ambulatory Visit
Admission: RE | Admit: 2021-04-20 | Discharge: 2021-04-20 | Disposition: A | Discharge status: Home / Self Care | Payer: Commercial Managed Care - PPO | Source: Ambulatory Visit | Attending: Orthopedic Surgery | Admitting: Orthopedic Surgery

## 2021-04-20 DIAGNOSIS — M25512 Pain in left shoulder: Secondary | ICD-10-CM

## 2021-04-20 DIAGNOSIS — M25522 Pain in left elbow: Secondary | ICD-10-CM

## 2022-03-08 LAB — EXTERNAL GENERIC LAB PROCEDURE: COLOGUARD: NEGATIVE
